# Patient Record
Sex: Female | Born: 1970 | Race: Black or African American | Hispanic: No | Marital: Single | Smoking: Current every day smoker
Health system: Southern US, Community
[De-identification: ages and names within clinical notes are randomized; demographics above are authoritative.]

---

## 2004-07-08 ENCOUNTER — Observation Stay: Payer: Self-pay

## 2004-08-04 ENCOUNTER — Observation Stay: Payer: Self-pay | Admitting: Certified Nurse Midwife

## 2004-08-18 ENCOUNTER — Observation Stay: Payer: Self-pay

## 2004-10-01 ENCOUNTER — Inpatient Hospital Stay: Payer: Self-pay | Admitting: Obstetrics and Gynecology

## 2009-06-25 ENCOUNTER — Encounter: Payer: Self-pay | Admitting: Obstetrics and Gynecology

## 2009-10-12 ENCOUNTER — Ambulatory Visit: Payer: Self-pay | Admitting: Family Medicine

## 2009-11-04 ENCOUNTER — Observation Stay: Payer: Self-pay | Admitting: Obstetrics and Gynecology

## 2009-11-06 ENCOUNTER — Inpatient Hospital Stay: Payer: Self-pay | Admitting: Obstetrics and Gynecology

## 2010-03-09 ENCOUNTER — Emergency Department: Payer: Self-pay | Admitting: Emergency Medicine

## 2015-09-22 ENCOUNTER — Encounter: Payer: Self-pay | Admitting: Emergency Medicine

## 2015-09-22 ENCOUNTER — Emergency Department
Admission: EM | Admit: 2015-09-22 | Discharge: 2015-09-22 | Disposition: A | Discharge status: Home / Self Care | Payer: Medicaid Other | Attending: Emergency Medicine | Admitting: Emergency Medicine

## 2015-09-22 DIAGNOSIS — R350 Frequency of micturition: Secondary | ICD-10-CM | POA: Diagnosis present

## 2015-09-22 DIAGNOSIS — Z3202 Encounter for pregnancy test, result negative: Secondary | ICD-10-CM | POA: Diagnosis not present

## 2015-09-22 DIAGNOSIS — F17200 Nicotine dependence, unspecified, uncomplicated: Secondary | ICD-10-CM | POA: Diagnosis not present

## 2015-09-22 DIAGNOSIS — N390 Urinary tract infection, site not specified: Secondary | ICD-10-CM | POA: Diagnosis not present

## 2015-09-22 LAB — URINALYSIS COMPLETE WITH MICROSCOPIC (ARMC ONLY)
BACTERIA UA: NONE SEEN
Bilirubin Urine: NEGATIVE
GLUCOSE, UA: NEGATIVE mg/dL
Ketones, ur: NEGATIVE mg/dL
Nitrite: NEGATIVE
PH: 6 (ref 5.0–8.0)
PROTEIN: 100 mg/dL — AB
SPECIFIC GRAVITY, URINE: 1.023 (ref 1.005–1.030)

## 2015-09-22 LAB — POCT PREGNANCY, URINE: PREG TEST UR: NEGATIVE

## 2015-09-22 MED ORDER — SULFAMETHOXAZOLE-TRIMETHOPRIM 800-160 MG PO TABS: 1.0000 | ORAL_TABLET | Freq: Two times a day (BID) | ORAL | Status: DC

## 2015-09-22 MED ORDER — PHENAZOPYRIDINE HCL 100 MG PO TABS: ORAL_TABLET | ORAL | Status: DC

## 2015-09-22 NOTE — ED Notes (Signed)
Reports urinary frequency and once she goes not much comes out.  Pelvic pain.  NAD

## 2015-09-22 NOTE — ED Notes (Signed)
POC preg test negative.  

## 2015-09-22 NOTE — ED Provider Notes (Signed)
[facility] Emergency Department Provider Note ____________________________________________  Time seen: Approximately 12:43 PM  I have reviewed the triage vital signs and the nursing notes.   HISTORY  Chief Complaint Urinary Frequency   HPI Joanna Marks is a 45 y.o. female is here complaining of urinary frequency since yesterday. Patient denies any fever or chills. She has had no nausea or vomiting. The patient has had a history of urinary tract infections in the past but not recently. She denies any vaginal discharge.   History reviewed. No pertinent past medical history.  There are no active problems to display for this patient.   History reviewed. No pertinent past surgical history.  Current Outpatient Rx  Name  Route  Sig  Dispense  Refill  . phenazopyridine (PYRIDIUM) 100 MG tablet      Take 1-2 tablets tid prn burning   20 tablet   0   . sulfamethoxazole-trimethoprim (BACTRIM DS,SEPTRA DS) 800-160 MG tablet   Oral   Take 1 tablet by mouth 2 (two) times daily.   20 tablet   0     Allergies Review of patient's allergies indicates no known allergies.  History reviewed. No pertinent family history.  Social History Social History  Substance Use Topics  . Smoking status: Current Every Day Smoker  . Smokeless tobacco: None  . Alcohol Use: None    Review of Systems Constitutional: No fever/chills ENT: No sore throat. Cardiovascular: Denies chest pain. Respiratory: Denies shortness of breath. Gastrointestinal: Positive lower abdominal pain.  No nausea, no vomiting.   Genitourinary: Positive for dysuria. Musculoskeletal: Negative for back pain. Skin: Negative for rash. Neurological: Negative for headaches, focal weakness or numbness.  10-point ROS otherwise negative.  ____________________________________________   PHYSICAL EXAM:  VITAL SIGNS: ED Triage Vitals  Enc Vitals Group     BP 09/22/15 1214 126/58 mmHg     Pulse  Rate 09/22/15 1214 89     Resp 09/22/15 1214 18     Temp 09/22/15 1214 97.3 F (36.3 C)     Temp Source 09/22/15 1214 Oral     SpO2 09/22/15 1214 98 %     Weight 09/22/15 1214 204 lb (92.534 kg)     Height 09/22/15 1214 5\' 3"  (1.6 m)     Head Cir --      Peak Flow --      Pain Score 09/22/15 1215 4     Pain Loc --      Pain Edu? --      Excl. in [place]? --     Constitutional: Alert and oriented. Well appearing and in no acute distress. Eyes: Conjunctivae are normal. PERRL. EOMI. Head: Atraumatic. Nose: No congestion/rhinnorhea. Neck: No stridor.   Cardiovascular: Normal rate, regular rhythm. Grossly normal heart sounds.  Good peripheral circulation. Respiratory: Normal respiratory effort.  No retractions. Lungs CTAB. Gastrointestinal: Soft and nontender. No distention. No CVA tenderness. Musculoskeletal: No lower extremity tenderness nor edema.  No joint effusions. Neurologic:  Normal speech and language. No gross focal neurologic deficits are appreciated. No gait instability. Skin:  Skin is warm, dry and intact. No rash noted. Psychiatric: Mood and affect are normal. Speech and behavior are normal.  ____________________________________________   LABS (all labs ordered are listed, but only abnormal results are displayed)  Labs Reviewed  URINALYSIS COMPLETEWITH MICROSCOPIC ([facility] ONLY) - Abnormal; Notable for the following:    Color, Urine YELLOW (*)    APPearance CLOUDY (*)    Hgb urine dipstick 3+ (*)  Protein, ur 100 (*)    Leukocytes, UA 3+ (*)    Squamous Epithelial / LPF 6-30 (*)    All other components within normal limits  POC URINE PREG, ED  POCT PREGNANCY, URINE    PROCEDURES  Procedure(s) performed: None  Critical Care performed: No  ____________________________________________   INITIAL IMPRESSION / ASSESSMENT AND PLAN / ED COURSE  Pertinent labs & imaging results that were available during my care of the patient were reviewed by me and considered in  my medical decision making (see chart for details).  Patient was started on Bactrim DS twice a day for 10 days. She is also given a prescription for Pyridium as needed for dysuria. Patient is follow-up with her primary care doctor after finishing the antibiotic. ____________________________________________   FINAL CLINICAL IMPRESSION(S) / ED DIAGNOSES  Final diagnoses:  Acute urinary tract infection      Johnn Hai, PA-C 09/22/15 1652  Harvest Dark, MD 09/23/15 2257

## 2015-09-22 NOTE — Discharge Instructions (Signed)
Take medication until completely finished as directed on the bottle. Be aware that your urine will change colors with the Pyridium. Septra DS for infection and pyridium  Increase fluids. Tylenol as needed for pain. Follow-up at [facility] after finishing the antibiotic to have her urine tested.

## 2015-09-22 NOTE — ED Notes (Signed)
Pt c/o pain above groin after voiding this AM.

## 2017-08-29 ENCOUNTER — Encounter: Payer: Self-pay | Admitting: Medical Oncology

## 2017-08-29 ENCOUNTER — Emergency Department
Admission: EM | Admit: 2017-08-29 | Discharge: 2017-08-29 | Disposition: A | Discharge status: Home / Self Care | Payer: Self-pay | Attending: Emergency Medicine | Admitting: Emergency Medicine

## 2017-08-29 DIAGNOSIS — R52 Pain, unspecified: Secondary | ICD-10-CM | POA: Insufficient documentation

## 2017-08-29 DIAGNOSIS — R69 Illness, unspecified: Secondary | ICD-10-CM

## 2017-08-29 DIAGNOSIS — R509 Fever, unspecified: Secondary | ICD-10-CM | POA: Insufficient documentation

## 2017-08-29 DIAGNOSIS — J111 Influenza due to unidentified influenza virus with other respiratory manifestations: Secondary | ICD-10-CM

## 2017-08-29 DIAGNOSIS — F17200 Nicotine dependence, unspecified, uncomplicated: Secondary | ICD-10-CM | POA: Insufficient documentation

## 2017-08-29 DIAGNOSIS — J029 Acute pharyngitis, unspecified: Secondary | ICD-10-CM | POA: Insufficient documentation

## 2017-08-29 LAB — GROUP A STREP BY PCR: Group A Strep by PCR: NOT DETECTED

## 2017-08-29 LAB — INFLUENZA PANEL BY PCR (TYPE A & B)
Influenza A By PCR: NEGATIVE
Influenza B By PCR: NEGATIVE

## 2017-08-29 MED ORDER — PSEUDOEPH-BROMPHEN-DM 30-2-10 MG/5ML PO SYRP: 5.0000 mL | ORAL_SOLUTION | Freq: Four times a day (QID) | ORAL | 0 refills | Status: DC | PRN

## 2017-08-29 NOTE — ED Notes (Signed)
See triage note  States she woke up with fever of 101 about 4 am  Body aches and sore throat  Low grade fever on arrival

## 2017-08-29 NOTE — ED Triage Notes (Signed)
Pt reports sore throat, body aches and fever that began yesterday.

## 2017-08-29 NOTE — ED Provider Notes (Signed)
[facility] Emergency Department Provider Note  ____________________________________________   First MD Initiated Contact with Patient 08/29/17 0901     (approximate)  I have reviewed the triage vital signs and the nursing notes.   HISTORY  Chief Complaint Sore Throat and Generalized Body Aches   HPI Joanna Marks is a 47 y.o. female is here with complaint of sore throat, body aches, and fever that began yesterday.  Patient states she is also had upper respiratory symptoms for "2 weeks".  She has not taken any over-the-counter medication and states that she felt worse today and could not go to work.  Currently she rates her pain as a 10 out of 10.   History reviewed. No pertinent past medical history.  There are no active problems to display for this patient.   History reviewed. No pertinent surgical history.  Prior to Admission medications   Medication Sig Start Date End Date Taking? Authorizing Provider  brompheniramine-pseudoephedrine-DM 30-2-10 MG/5ML syrup Take 5 mLs by mouth 4 (four) times daily as needed. 08/29/17   Johnn Hai, PA-C    Allergies Penicillins  No family history on file.  Social History Social History   Tobacco Use  . Smoking status: Current Every Day Smoker  Substance Use Topics  . Alcohol use: Not on file  . Drug use: Not on file    Review of Systems Constitutional: Subjective fever/chills Eyes: No visual changes. ENT: Positive sore throat. Cardiovascular: Denies chest pain. Respiratory: Denies shortness of breath. Gastrointestinal: No abdominal pain.  No nausea, no vomiting.  No diarrhea.   Genitourinary: Negative for dysuria. Musculoskeletal: Positive for body aches. Skin: Negative for rash. Neurological: Negative for headaches, focal weakness or numbness. ___________________________________________   PHYSICAL EXAM:  VITAL SIGNS: ED Triage Vitals [08/29/17 0740]  Enc Vitals Group     BP (!)  153/91     Pulse Rate 100     Resp 18     Temp 99.5 F (37.5 C)     Temp Source Oral     SpO2 98 %     Weight 190 lb (86.2 kg)     Height 5\' 4"  (1.626 m)     Head Circumference      Peak Flow      Pain Score 10     Pain Loc      Pain Edu?      Excl. in [place]?    Constitutional: Alert and oriented. Well appearing and in no acute distress. Eyes: Conjunctivae are normal.  Head: Atraumatic. Nose: Moderate congestion/rhinnorhea.  TMs are dull bilaterally. Mouth/Throat: Mucous membranes are moist.  Oropharynx non-erythematous. Neck: No stridor.   Hematological/Lymphatic/Immunilogical: No cervical lymphadenopathy. Cardiovascular: Normal rate, regular rhythm. Grossly normal heart sounds.  Good peripheral circulation. Respiratory: Normal respiratory effort.  No retractions. Lungs CTAB. Gastrointestinal: Soft and nontender. No distention.  No CVA tenderness. Musculoskeletal: Moves upper and lower extremities without any difficulty.  Normal gait was noted. Neurologic:  Normal speech and language. No gross focal neurologic deficits are appreciated. Skin:  Skin is warm, dry and intact. No rash noted. Psychiatric: Mood and affect are normal. Speech and behavior are normal.  ____________________________________________   LABS (all labs ordered are listed, but only abnormal results are displayed)  Labs Reviewed  GROUP A STREP BY PCR  INFLUENZA PANEL BY PCR (TYPE A & B)     PROCEDURES  Procedure(s) performed: None  Procedures  Critical Care performed: No  ____________________________________________   INITIAL IMPRESSION /  ASSESSMENT AND PLAN / ED COURSE Patient was made aware that her strep and influenza test was negative.  Patient was given a note to remain out of work today.  She is also given a prescription for Bromfed-DM.  She is aware that viral illnesses take up to 3 weeks to resolve.  She will continue to increase fluids and take Tylenol or ibuprofen as needed for body aches  or fever.  Patient will follow up with her PCP at [facility] if any continued problems.  ____________________________________________   FINAL CLINICAL IMPRESSION(S) / ED DIAGNOSES  Final diagnoses:  Influenza-like illness     ED Discharge Orders        Ordered    brompheniramine-pseudoephedrine-DM 30-2-10 MG/5ML syrup  4 times daily PRN     08/29/17 0954       Note:  This document was prepared using Dragon voice recognition software and may include unintentional dictation errors.    Johnn Hai, PA-C 08/29/17 1046    Eula Listen, MD 08/29/17 1229

## 2017-08-29 NOTE — Discharge Instructions (Signed)
Follow-up with [facility] if any continued problems.  Begin taking Bromfed as needed for cough and congestion.  Tylenol or ibuprofen as needed for fever or body aches.  Increase fluids.

## 2019-09-16 ENCOUNTER — Other Ambulatory Visit: Payer: Self-pay | Admitting: Family Medicine

## 2019-09-16 ENCOUNTER — Encounter: Payer: Self-pay | Admitting: Family Medicine

## 2019-09-16 DIAGNOSIS — Z1231 Encounter for screening mammogram for malignant neoplasm of breast: Secondary | ICD-10-CM

## 2019-09-19 ENCOUNTER — Other Ambulatory Visit: Payer: Self-pay

## 2019-09-19 ENCOUNTER — Telehealth: Payer: Self-pay

## 2019-09-19 DIAGNOSIS — Z1211 Encounter for screening for malignant neoplasm of colon: Secondary | ICD-10-CM

## 2019-09-19 NOTE — Telephone Encounter (Signed)
Gastroenterology Pre-Procedure Review  Request Date: Wed 10/09/19 Requesting Physician: Dr. Marius Ditch  PATIENT REVIEW QUESTIONS: The patient responded to the following health history questions as indicated:    1. Are you having any GI issues? no 2. Do you have a personal history of Polyps? no 3. Do you have a family history of Colon Cancer or Polyps? yes (Grandfather had colon cancer) 4. Diabetes Mellitus? no 5. Joint replacements in the past 12 months?no 6. Major health problems in the past 3 months?no 7. Any artificial heart valves, MVP, or defibrillator?no    MEDICATIONS & ALLERGIES:    Patient reports the following regarding taking any anticoagulation/antiplatelet therapy:   Plavix, Coumadin, Eliquis, Xarelto, Lovenox, Pradaxa, Brilinta, or Effient? no Aspirin? no  Patient confirms/reports the following medications:  Current Outpatient Medications  Medication Sig Dispense Refill  . brompheniramine-pseudoephedrine-DM 30-2-10 MG/5ML syrup Take 5 mLs by mouth 4 (four) times daily as needed. 120 mL 0   No current facility-administered medications for this visit.    Patient confirms/reports the following allergies:  Allergies  Allergen Reactions  . Penicillins     No orders of the defined types were placed in this encounter.   AUTHORIZATION INFORMATION Primary Insurance: 1D#: Group #:  Secondary Insurance: 1D#: Group #:  SCHEDULE INFORMATION: Date: 10/09/19 Time: Location:[facility]

## 2019-10-07 ENCOUNTER — Other Ambulatory Visit
Admission: RE | Admit: 2019-10-07 | Discharge: 2019-10-07 | Disposition: A | Discharge status: Home / Self Care | Payer: Medicaid Other | Source: Ambulatory Visit | Attending: Gastroenterology | Admitting: Gastroenterology

## 2019-10-07 DIAGNOSIS — Z01812 Encounter for preprocedural laboratory examination: Secondary | ICD-10-CM | POA: Insufficient documentation

## 2019-10-07 DIAGNOSIS — Z20822 Contact with and (suspected) exposure to covid-19: Secondary | ICD-10-CM | POA: Insufficient documentation

## 2019-10-08 ENCOUNTER — Encounter: Payer: Self-pay | Admitting: Gastroenterology

## 2019-10-08 LAB — SARS CORONAVIRUS 2 (TAT 6-24 HRS): SARS Coronavirus 2: NEGATIVE

## 2019-10-09 ENCOUNTER — Other Ambulatory Visit: Payer: Self-pay

## 2019-10-09 ENCOUNTER — Encounter: Payer: Self-pay | Admitting: Gastroenterology

## 2019-10-09 ENCOUNTER — Ambulatory Visit: Payer: Medicaid Other | Admitting: Anesthesiology

## 2019-10-09 ENCOUNTER — Ambulatory Visit
Admission: RE | Admit: 2019-10-09 | Discharge: 2019-10-09 | Disposition: A | Discharge status: Home / Self Care | Payer: Medicaid Other | Attending: Gastroenterology | Admitting: Gastroenterology

## 2019-10-09 ENCOUNTER — Encounter
Admission: RE | Disposition: A | Discharge status: Home / Self Care | Payer: Self-pay | Source: Home / Self Care | Attending: Gastroenterology

## 2019-10-09 DIAGNOSIS — Z880 Allergy status to penicillin: Secondary | ICD-10-CM | POA: Diagnosis not present

## 2019-10-09 DIAGNOSIS — K635 Polyp of colon: Secondary | ICD-10-CM

## 2019-10-09 DIAGNOSIS — Z800 Family history of malignant neoplasm of digestive organs: Secondary | ICD-10-CM | POA: Insufficient documentation

## 2019-10-09 DIAGNOSIS — Z1211 Encounter for screening for malignant neoplasm of colon: Secondary | ICD-10-CM | POA: Insufficient documentation

## 2019-10-09 DIAGNOSIS — D122 Benign neoplasm of ascending colon: Secondary | ICD-10-CM | POA: Diagnosis not present

## 2019-10-09 DIAGNOSIS — F17200 Nicotine dependence, unspecified, uncomplicated: Secondary | ICD-10-CM | POA: Insufficient documentation

## 2019-10-09 HISTORY — PX: COLONOSCOPY WITH PROPOFOL: SHX5780

## 2019-10-09 LAB — POCT PREGNANCY, URINE: Preg Test, Ur: NEGATIVE

## 2019-10-09 SURGERY — COLONOSCOPY WITH PROPOFOL
Anesthesia: General

## 2019-10-09 MED ORDER — PROPOFOL 10 MG/ML IV BOLUS
INTRAVENOUS | Status: DC | PRN
  Administered 2019-10-09: 50 mg via INTRAVENOUS
  Administered 2019-10-09: 80 mg via INTRAVENOUS

## 2019-10-09 MED ORDER — PROPOFOL 500 MG/50ML IV EMUL
INTRAVENOUS | Status: DC | PRN
  Administered 2019-10-09: 140 ug/kg/min via INTRAVENOUS

## 2019-10-09 MED ORDER — SODIUM CHLORIDE 0.9 % IV SOLN: INTRAVENOUS | Status: DC

## 2019-10-09 NOTE — Transfer of Care (Signed)
Immediate Anesthesia Transfer of Care Note  Patient: Joanna Marks  Procedure(s) Performed: COLONOSCOPY WITH PROPOFOL (N/A )  Patient Location: PACU  Anesthesia Type:General  Level of Consciousness: sedated  Airway & Oxygen Therapy: Patient Spontanous Breathing and Patient connected to nasal cannula oxygen  Post-op Assessment: Report given to RN and Post -op Vital signs reviewed and stable  Post vital signs: Reviewed and stable  Last Vitals:  Vitals Value Taken Time  BP 111/63 10/09/19 0952  Temp    Pulse 85 10/09/19 0952  Resp 25 10/09/19 0952  SpO2 98 % 10/09/19 0952  Vitals shown include unvalidated device data.  Last Pain:  Vitals:   10/09/19 0849  PainSc: 0-No pain         Complications: No apparent anesthesia complications

## 2019-10-09 NOTE — Anesthesia Preprocedure Evaluation (Signed)
Anesthesia Evaluation  Patient identified by MRN, date of birth, ID band Patient awake    Reviewed: Allergy & Precautions, H&P , NPO status , Patient's Chart, lab work & pertinent test results, reviewed documented beta blocker date and time   History of Anesthesia Complications Negative for: history of anesthetic complications  Airway Mallampati: II  TM Distance: >3 FB Neck ROM: full    Dental  (+) Dental Advidsory Given, Poor Dentition, Missing, Loose Top left central incisor is visibly loose:   Pulmonary neg pulmonary ROS, former smoker,    Pulmonary exam normal        Cardiovascular Exercise Tolerance: Good negative cardio ROS Normal cardiovascular exam     Neuro/Psych negative neurological ROS  negative psych ROS   GI/Hepatic negative GI ROS, Neg liver ROS,   Endo/Other  negative endocrine ROS  Renal/GU negative Renal ROS  negative genitourinary   Musculoskeletal   Abdominal   Peds  Hematology negative hematology ROS (+)   Anesthesia Other Findings History reviewed. No pertinent past medical history.  Obesity  Reproductive/Obstetrics negative OB ROS                             Anesthesia Physical Anesthesia Plan  ASA: II  Anesthesia Plan: General   Post-op Pain Management:    Induction: Intravenous  PONV Risk Score and Plan: 2 and Propofol infusion and TIVA  Airway Management Planned: Natural Airway and Nasal Cannula  Additional Equipment:   Intra-op Plan:   Post-operative Plan:   Informed Consent: I have reviewed the patients History and Physical, chart, labs and discussed the procedure including the risks, benefits and alternatives for the proposed anesthesia with the patient or authorized representative who has indicated his/her understanding and acceptance.     Dental Advisory Given  Plan Discussed with: Anesthesiologist, CRNA and Surgeon  Anesthesia Plan  Comments:         Anesthesia Quick Evaluation

## 2019-10-09 NOTE — H&P (Signed)
Cephas Darby, MD [address]  [place]  [place], [place] [address]  Main: [phone]  Fax: [phone] Pager: [phone]  Primary Care Physician:  Donnie Coffin, MD Primary Gastroenterologist:  Dr. Cephas Darby  Pre-Procedure History & Physical: HPI:  Joanna Marks is a 49 y.o. female is here for an colonoscopy.   History reviewed. No pertinent past medical history.  History reviewed. No pertinent surgical history.  Prior to Admission medications   Medication Sig Start Date End Date Taking? Authorizing Provider  brompheniramine-pseudoephedrine-DM 30-2-10 MG/5ML syrup Take 5 mLs by mouth 4 (four) times daily as needed. 08/29/17   Johnn Hai, PA-C    Allergies as of 09/19/2019 - Review Complete 08/29/2017  Allergen Reaction Noted  . Penicillins  08/29/2017    History reviewed. No pertinent family history.  Social History   Socioeconomic History  . Marital status: Single    Spouse name: Not on file  . Number of children: Not on file  . Years of education: Not on file  . Highest education level: Not on file  Occupational History  . Not on file  Tobacco Use  . Smoking status: Current Every Day Smoker  . Smokeless tobacco: Never Used  Substance and Sexual Activity  . Alcohol use: Not on file  . Drug use: Not on file  . Sexual activity: Not on file  Other Topics Concern  . Not on file  Social History Narrative  . Not on file   Social Determinants of Health   Financial Resource Strain:   . Difficulty of Paying Living Expenses: Not on file  Food Insecurity:   . Worried About Charity fundraiser in the Last Year: Not on file  . Ran Out of Food in the Last Year: Not on file  Transportation Needs:   . Lack of Transportation (Medical): Not on file  . Lack of Transportation (Non-Medical): Not on file  Physical Activity:   . Days of Exercise per Week: Not on file  . Minutes of Exercise per Session: Not on file  Stress:   . Feeling of Stress  : Not on file  Social Connections:   . Frequency of Communication with Friends and Family: Not on file  . Frequency of Social Gatherings with Friends and Family: Not on file  . Attends Religious Services: Not on file  . Active Member of Clubs or Organizations: Not on file  . Attends Archivist Meetings: Not on file  . Marital Status: Not on file  Intimate Partner Violence:   . Fear of Current or Ex-Partner: Not on file  . Emotionally Abused: Not on file  . Physically Abused: Not on file  . Sexually Abused: Not on file    Review of Systems: See HPI, otherwise negative ROS  Physical Exam: BP 117/78   Pulse 73   Temp 97.7 F (36.5 C)   Resp 18   Ht 5\' 5"  (1.651 m)   Wt 91.6 kg   SpO2 100%   BMI 33.61 kg/m  General:   Alert,  pleasant and cooperative in NAD Head:  Normocephalic and atraumatic. Neck:  Supple; no masses or thyromegaly. Lungs:  Clear throughout to auscultation.    Heart:  Regular rate and rhythm. Abdomen:  Soft, nontender and nondistended. Normal bowel sounds, without guarding, and without rebound.   Neurologic:  Alert and  oriented x4;  grossly normal neurologically.  Impression/Plan: Joanna Marks is here for an colonoscopy to be performed  for colon cancer screening, fam h/o colon cancer   Risks, benefits, limitations, and alternatives regarding  colonoscopy have been reviewed with the patient.  Questions have been answered.  All parties agreeable.   Sherri Sear, MD  10/09/2019, 9:34 AM

## 2019-10-09 NOTE — Op Note (Signed)
[facility] Gastroenterology Patient Name: Joanna Marks Procedure Date: 10/09/2019 9:24 AM MRN: [id number] Account #: [id number] Date of Birth: 11-14-1970 Admit Type: Outpatient Age: 49 Room: [facility] ENDO ROOM 1 Gender: Female Note Status: Finalized Procedure:             Colonoscopy Indications:           Screening for colorectal malignant neoplasm, This is                         the patient's first colonoscopy Providers:             Lin Landsman MD, MD Referring MD:          Edmonia Lynch. Aycock MD (Referring MD) Medicines:             Monitored Anesthesia Care Complications:         No immediate complications. Estimated blood loss: None. Procedure:             Pre-Anesthesia Assessment:                        - Prior to the procedure, a History and Physical was                         performed, and patient medications and allergies were                         reviewed. The patient is competent. The risks and                         benefits of the procedure and the sedation options and                         risks were discussed with the patient. All questions                         were answered and informed consent was obtained.                         Patient identification and proposed procedure were                         verified by the physician, the nurse, the                         anesthesiologist, the anesthetist and the technician                         in the pre-procedure area in the procedure room in the                         endoscopy suite. Mental Status Examination: alert and                         oriented. Airway Examination: normal oropharyngeal                         airway and neck mobility. Respiratory Examination:  clear to auscultation. CV Examination: normal.                         Prophylactic Antibiotics: The patient does not require                         prophylactic antibiotics. Prior  Anticoagulants: The                         patient has taken no previous anticoagulant or                         antiplatelet agents. ASA Grade Assessment: II - A                         patient with mild systemic disease. After reviewing                         the risks and benefits, the patient was deemed in                         satisfactory condition to undergo the procedure. The                         anesthesia plan was to use monitored anesthesia care                         (MAC). Immediately prior to administration of                         medications, the patient was re-assessed for adequacy                         to receive sedatives. The heart rate, respiratory                         rate, oxygen saturations, blood pressure, adequacy of                         pulmonary ventilation, and response to care were                         monitored throughout the procedure. The physical                         status of the patient was re-assessed after the                         procedure.                        After obtaining informed consent, the colonoscope was                         passed under direct vision. Throughout the procedure,                         the patient's blood pressure, pulse, and oxygen  saturations were monitored continuously. The                         Colonoscope was introduced through the anus and                         advanced to the the cecum, identified by appendiceal                         orifice and ileocecal valve. The colonoscopy was                         performed without difficulty. The patient tolerated                         the procedure well. The quality of the bowel                         preparation was evaluated using the BBPS [facility] Bowel                         Preparation Scale) with scores of: Right Colon = 3,                         Transverse Colon = 3 and Left Colon = 3 (entire mucosa                          seen well with no residual staining, small fragments                         of stool or opaque liquid). The total BBPS score                         equals 9. Findings:      The perianal and digital rectal examinations were normal. Pertinent       negatives include normal sphincter tone and no palpable rectal lesions.      A diminutive polyp was found in the ascending colon. The polyp was       sessile. The polyp was removed with a cold biopsy forceps. Resection and       retrieval were complete.      The retroflexed view of the distal rectum and anal verge was normal and       showed no anal or rectal abnormalities.      The exam was otherwise without abnormality. Impression:            - One diminutive polyp in the ascending colon, removed                         with a cold biopsy forceps. Resected and retrieved.                        - The distal rectum and anal verge are normal on                         retroflexion view.                        -  The examination was otherwise normal. Recommendation:        - Discharge patient to home (with escort).                        - Resume previous diet today.                        - Continue present medications.                        - Await pathology results.                        - Repeat colonoscopy in 7-10 years for surveillance. Procedure Code(s):     --- Professional ---                        [phone], Colonoscopy, flexible; with biopsy, single or                         multiple Diagnosis Code(s):     --- Professional ---                        Z12.11, Encounter for screening for malignant neoplasm                         of colon                        K63.5, Polyp of colon CPT copyright 2019 [employer] Association. All rights reserved. The codes documented in this report are preliminary and upon coder review may  be revised to meet current compliance requirements. Dr. Ulyess Mort Lin Landsman  MD, MD 10/09/2019 9:50:44 AM This report has been signed electronically. Number of Addenda: 0 Note Initiated On: 10/09/2019 9:24 AM Scope Withdrawal Time: 0 hours 5 minutes 16 seconds  Total Procedure Duration: 0 hours 8 minutes 22 seconds  Estimated Blood Loss:  Estimated blood loss: none.      [facility]

## 2019-10-09 NOTE — Anesthesia Postprocedure Evaluation (Signed)
Anesthesia Post Note  Patient: Joanna Marks  Procedure(s) Performed: COLONOSCOPY WITH PROPOFOL (N/A )  Patient location during evaluation: Endoscopy Anesthesia Type: General Level of consciousness: awake and alert Pain management: pain level controlled Vital Signs Assessment: post-procedure vital signs reviewed and stable Respiratory status: spontaneous breathing, nonlabored ventilation, respiratory function stable and patient connected to nasal cannula oxygen Cardiovascular status: blood pressure returned to baseline and stable Postop Assessment: no apparent nausea or vomiting Anesthetic complications: no     Last Vitals:  Vitals:   10/09/19 1011 10/09/19 1021  BP: 125/88 (!) 158/98  Pulse: 77 70  Resp: 12 19  Temp:    SpO2: 100% 100%    Last Pain:  Vitals:   10/09/19 1021  TempSrc:   PainSc: 0-No pain                 Martha Clan

## 2019-10-10 ENCOUNTER — Encounter: Payer: Self-pay | Admitting: *Deleted

## 2019-10-10 LAB — SURGICAL PATHOLOGY

## 2019-10-11 ENCOUNTER — Encounter: Payer: Self-pay | Admitting: Gastroenterology

## 2019-10-14 ENCOUNTER — Encounter: Payer: Self-pay | Admitting: Emergency Medicine

## 2019-10-14 ENCOUNTER — Emergency Department: Payer: Medicaid Other

## 2019-10-14 ENCOUNTER — Emergency Department
Admission: EM | Admit: 2019-10-14 | Discharge: 2019-10-14 | Disposition: A | Discharge status: Home / Self Care | Payer: Medicaid Other | Attending: Student | Admitting: Student

## 2019-10-14 ENCOUNTER — Other Ambulatory Visit: Payer: Self-pay

## 2019-10-14 DIAGNOSIS — M545 Low back pain: Secondary | ICD-10-CM | POA: Insufficient documentation

## 2019-10-14 DIAGNOSIS — Y93E1 Activity, personal bathing and showering: Secondary | ICD-10-CM | POA: Diagnosis not present

## 2019-10-14 DIAGNOSIS — Y999 Unspecified external cause status: Secondary | ICD-10-CM | POA: Diagnosis not present

## 2019-10-14 DIAGNOSIS — M7918 Myalgia, other site: Secondary | ICD-10-CM | POA: Insufficient documentation

## 2019-10-14 DIAGNOSIS — Y929 Unspecified place or not applicable: Secondary | ICD-10-CM | POA: Diagnosis not present

## 2019-10-14 DIAGNOSIS — M549 Dorsalgia, unspecified: Secondary | ICD-10-CM

## 2019-10-14 DIAGNOSIS — F17200 Nicotine dependence, unspecified, uncomplicated: Secondary | ICD-10-CM | POA: Insufficient documentation

## 2019-10-14 DIAGNOSIS — W01198A Fall on same level from slipping, tripping and stumbling with subsequent striking against other object, initial encounter: Secondary | ICD-10-CM | POA: Insufficient documentation

## 2019-10-14 DIAGNOSIS — Y92009 Unspecified place in unspecified non-institutional (private) residence as the place of occurrence of the external cause: Secondary | ICD-10-CM

## 2019-10-14 DIAGNOSIS — W19XXXA Unspecified fall, initial encounter: Secondary | ICD-10-CM

## 2019-10-14 MED ORDER — OXYCODONE-ACETAMINOPHEN 7.5-325 MG PO TABS: 1.0000 | ORAL_TABLET | Freq: Four times a day (QID) | ORAL | 0 refills | Status: AC | PRN

## 2019-10-14 MED ORDER — IBUPROFEN 600 MG PO TABS: 600.0000 mg | ORAL_TABLET | Freq: Three times a day (TID) | ORAL | 0 refills | Status: AC | PRN

## 2019-10-14 MED ORDER — OXYCODONE-ACETAMINOPHEN 5-325 MG PO TABS
1.0000 | ORAL_TABLET | Freq: Once | ORAL | Status: AC
  Administered 2019-10-14: 1 via ORAL
  Filled 2019-10-14: qty 1

## 2019-10-14 MED ORDER — CYCLOBENZAPRINE HCL 10 MG PO TABS
10.0000 mg | ORAL_TABLET | Freq: Once | ORAL | Status: AC
  Administered 2019-10-14: 10 mg via ORAL
  Filled 2019-10-14: qty 1

## 2019-10-14 NOTE — Discharge Instructions (Signed)
Follow discharge care instruction take medication as directed. °

## 2019-10-14 NOTE — ED Provider Notes (Signed)
[facility] Emergency Department Provider Note   ____________________________________________   First MD Initiated Contact with Patient 10/14/19 [phone]     (approximate)  I have reviewed the triage vital signs and the nursing notes.   HISTORY  Chief Complaint Fall and Back Pain    HPI Joanna Marks is a 49 y.o. female patient complaint of mechanical fall while getting out of shower today.  Patient that she slipped and fell hitting her lower back on the tub.  Patient denies LOC, head injury or neck pain.  Patient the pain increased with ambulation.  Patient denies radicular component to her back pain.  Patient denies bladder or bowel dysfunction.  Patient was driven to the ED by her husband.  Patient rates the pain as a 10/10.  Patient described the pain as "achy".  No palliative measure prior to arrival.         History reviewed. No pertinent past medical history.  Patient Active Problem List   Diagnosis Date Noted  . Encounter for screening colonoscopy     Past Surgical History:  Procedure Laterality Date  . COLONOSCOPY WITH PROPOFOL N/A 10/09/2019   Procedure: COLONOSCOPY WITH PROPOFOL;  Surgeon: Lin Landsman, MD;  Location: [facility] ENDOSCOPY;  Service: Gastroenterology;  Laterality: N/A;  Priority 4    Prior to Admission medications   Medication Sig Start Date End Date Taking? Authorizing Provider  ibuprofen (ADVIL) 600 MG tablet Take 1 tablet (600 mg total) by mouth every 8 (eight) hours as needed. 10/14/19   Sable Feil, PA-C  oxyCODONE-acetaminophen (PERCOCET) 7.5-325 MG tablet Take 1 tablet by mouth every 6 (six) hours as needed for severe pain. 10/14/19   Sable Feil, PA-C    Allergies Penicillins  No family history on file.  Social History Social History   Tobacco Use  . Smoking status: Current Every Day Smoker  . Smokeless tobacco: Never Used  Substance Use Topics  . Alcohol use: Not on file  . Drug use: Not on file     Review of Systems Constitutional: No fever/chills Eyes: No visual changes. ENT: No sore throat. Cardiovascular: Denies chest pain. Respiratory: Denies shortness of breath. Gastrointestinal: No abdominal pain.  No nausea, no vomiting.  No diarrhea.  No constipation. Genitourinary: Negative for dysuria. Musculoskeletal: Positive for back pain. Skin: Negative for rash. Neurological: Negative for headaches, focal weakness or numbness. Allergic/Immunilogical: Penicillin  ____________________________________________   PHYSICAL EXAM:  VITAL SIGNS: ED Triage Vitals [10/14/19 0903]  Enc Vitals Group     BP (!) 148/79     Pulse Rate 79     Resp 16     Temp 98.3 F (36.8 C)     Temp Source Oral     SpO2 97 %     Weight 190 lb (86.2 kg)     Height 5\' 4"  (1.626 m)     Head Circumference      Peak Flow      Pain Score 10     Pain Loc      Pain Edu?      Excl. in [place]?    Constitutional: Alert and oriented. Well appearing and in no acute distress. Neck: No cervical spine tenderness to palpation. Hematological/Lymphatic/Immunilogical: No cervical lymphadenopathy. Cardiovascular: Normal rate, regular rhythm. Grossly normal heart sounds.  Good peripheral circulation. Respiratory: Normal respiratory effort.  No retractions. Lungs CTAB. Gastrointestinal: Soft and nontender. No distention. No abdominal bruits. No CVA tenderness. Genitourinary: Deferred Musculoskeletal: No obvious spinal deformity.  Patient has mild guarding palpation of L4-S1.  No lower extremity tenderness nor edema.   Neurologic:  Normal speech and language. No gross focal neurologic deficits are appreciated. No gait instability. Skin:  Skin is warm, dry and intact. No rash noted. Psychiatric: Mood and affect are normal. Speech and behavior are normal.  ____________________________________________   LABS (all labs ordered are listed, but only abnormal results are displayed)  Labs Reviewed - No data to  display ____________________________________________  EKG   ____________________________________________  RADIOLOGY  ED MD interpretation:    Official radiology report(s): DG Lumbar Spine 2-3 Views  Result Date: 10/14/2019 CLINICAL DATA:  Right low back pain.  Fall. EXAM: LUMBAR SPINE - 2-3 VIEW COMPARISON:  None. FINDINGS: Normal alignment. Disc spaces maintained. Early anterior degenerative spurring and degenerative facet disease. SI joints symmetric and unremarkable. No fracture. IMPRESSION: No acute bony abnormality. Electronically Signed   By: Rolm Baptise M.D.   On: 10/14/2019 09:44    ____________________________________________   PROCEDURES  Procedure(s) performed (including Critical Care):  Procedures   ____________________________________________   INITIAL IMPRESSION / ASSESSMENT AND PLAN / ED COURSE  As part of my medical decision making, I reviewed the following data within the [place]     Patient presents with right lateral back pain secondary to a fall.  Patient denies radicular component to her back pain.  Patient denies bladder bowel dysfunction.  Discussed x-ray findings with patient.  Patient physical exam consistent with back contusion.  Patient given discharge care instruction advised take medication as directed.  Patient advised follow-up PCP.    Joanna Marks was evaluated in Emergency Department on 10/14/2019 for the symptoms described in the history of present illness. She was evaluated in the context of the global COVID-19 pandemic, which necessitated consideration that the patient might be at risk for infection with the SARS-CoV-2 virus that causes COVID-19. Institutional protocols and algorithms that pertain to the evaluation of patients at risk for COVID-19 are in a state of rapid change based on information released by regulatory bodies including the CDC and federal and state organizations. These policies and algorithms were  followed during the patient's care in the ED.       ____________________________________________   FINAL CLINICAL IMPRESSION(S) / ED DIAGNOSES  Final diagnoses:  Fall in home, initial encounter  Musculoskeletal back pain     ED Discharge Orders         Ordered    oxyCODONE-acetaminophen (PERCOCET) 7.5-325 MG tablet  Every 6 hours PRN     10/14/19 1003    ibuprofen (ADVIL) 600 MG tablet  Every 8 hours PRN     10/14/19 1003           Note:  This document was prepared using Dragon voice recognition software and may include unintentional dictation errors.    Sable Feil, PA-C 10/14/19 1005    Lilia Pro., MD 10/14/19 1910

## 2019-10-14 NOTE — ED Notes (Signed)
See triage note  Presents with pain to lower back   States she slipped in shower  Hitting her back

## 2019-10-14 NOTE — ED Triage Notes (Signed)
Pt in via POV from home, reports mechanical fall as she was getting out of shower today, complaints of pain to lower back with difficulty ambulating.  NAD noted at this time.

## 2020-02-21 ENCOUNTER — Other Ambulatory Visit: Payer: Self-pay

## 2020-02-24 ENCOUNTER — Other Ambulatory Visit: Payer: Medicaid Other

## 2020-02-25 ENCOUNTER — Other Ambulatory Visit: Payer: Medicaid Other

## 2020-03-03 ENCOUNTER — Other Ambulatory Visit: Payer: Self-pay

## 2020-03-03 ENCOUNTER — Ambulatory Visit (INDEPENDENT_AMBULATORY_CARE_PROVIDER_SITE_OTHER): Payer: Medicaid Other

## 2020-03-03 ENCOUNTER — Encounter: Payer: Self-pay | Admitting: Podiatry

## 2020-03-03 ENCOUNTER — Ambulatory Visit: Payer: Medicaid Other | Admitting: Podiatry

## 2020-03-03 DIAGNOSIS — M7661 Achilles tendinitis, right leg: Secondary | ICD-10-CM | POA: Diagnosis not present

## 2020-03-03 DIAGNOSIS — M7660 Achilles tendinitis, unspecified leg: Secondary | ICD-10-CM | POA: Diagnosis not present

## 2020-03-03 DIAGNOSIS — M7662 Achilles tendinitis, left leg: Secondary | ICD-10-CM

## 2020-03-03 DIAGNOSIS — M659 Synovitis and tenosynovitis, unspecified: Secondary | ICD-10-CM | POA: Diagnosis not present

## 2020-03-03 MED ORDER — METHYLPREDNISOLONE 4 MG PO TBPK: ORAL_TABLET | ORAL | 0 refills | Status: DC

## 2020-03-03 MED ORDER — MELOXICAM 15 MG PO TABS: 15.0000 mg | ORAL_TABLET | Freq: Every day | ORAL | 1 refills | Status: AC

## 2020-03-03 NOTE — Progress Notes (Signed)
   HPI: 49 y.o. female presenting today as a new patient for evaluation of bilateral Achilles tendon pain and right ankle pain.  Patient states that she has had chronic pain in these areas off and on for 5 years now.  At one point she did receive steroid injections which did help approximately 4 years ago.  Patient denies trauma or injury to the area.  She presents for further treatment evaluation  No past medical history on file.    Physical Exam: General: The patient is alert and oriented x3 in no acute distress.  Dermatology: Skin is warm, dry and supple bilateral lower extremities. Negative for open lesions or macerations.  Vascular: Palpable pedal pulses bilaterally. No edema or erythema noted. Capillary refill within normal limits.  Neurological: Epicritic and protective threshold grossly intact bilaterally.   Musculoskeletal Exam: Pain on palpation noted to the posterior tubercle of the bilateral calcaneus at the insertion of the Achilles tendon consistent with retrocalcaneal bursitis. Range of motion within normal limits.  There is also pain on palpation and range of motion to the lateral aspect of the right ankle joint.  Muscle strength 5/5 in all muscle groups bilateral lower extremities.  Radiographic Exam:  Posterior calcaneal spur noted to the respective calcaneus on lateral view. No fracture or dislocation noted. Normal osseous mineralization noted.     Assessment: 1. Insertional Achilles tendinitis bilateral 2.  Synovitis of right ankle   Plan of Care:  1. Patient was evaluated. Radiographs were reviewed today. 2. Injection of 0.5 mL Celestone Soluspan injected into the retrocalcaneal bursa. Care was taken to avoid direct injection into the Achilles tendon. 3.  Injection of 0.5 cc Celestone Soluspan injected into the right ankle joint lateral aspect 4.  Prescription for Medrol Dosepak 5.  Prescription for meloxicam to begin after completion of the Dosepak 6.  Appointment  with Pedorthist for custom molded orthotics 7.  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 Edrick Kins, [address]                                        [place], [place] [address]                Office [phone]  Fax [phone]

## 2020-03-10 ENCOUNTER — Other Ambulatory Visit: Payer: Medicaid Other

## 2020-03-31 ENCOUNTER — Other Ambulatory Visit: Payer: Self-pay

## 2020-03-31 ENCOUNTER — Ambulatory Visit: Payer: Medicaid Other | Admitting: Podiatry

## 2020-03-31 DIAGNOSIS — M7661 Achilles tendinitis, right leg: Secondary | ICD-10-CM

## 2020-03-31 DIAGNOSIS — M7662 Achilles tendinitis, left leg: Secondary | ICD-10-CM

## 2020-03-31 DIAGNOSIS — M659 Synovitis and tenosynovitis, unspecified: Secondary | ICD-10-CM

## 2020-03-31 NOTE — Progress Notes (Signed)
   HPI: 49 y.o. female presenting today for follow-up evaluation of bilateral Achilles tendon pain and right ankle pain.  Patient states that she is doing somewhat better.  She still has some residual pain and tenderness to the Achilles tendons and right ankle.  She has been stretching daily.  She also completed the Medrol Dosepak and is currently taking meloxicam daily.  She has an appointment tomorrow, 04/01/2020, with the Pedorthist for custom molded insoles.  No new complaints at this time  No past medical history on file.    Physical Exam: General: The patient is alert and oriented x3 in no acute distress.  Dermatology: Skin is warm, dry and supple bilateral lower extremities. Negative for open lesions or macerations.  Vascular: Palpable pedal pulses bilaterally. No edema or erythema noted. Capillary refill within normal limits.  Neurological: Epicritic and protective threshold grossly intact bilaterally.   Musculoskeletal Exam: Pain on palpation noted to the posterior tubercle of the bilateral calcaneus at the insertion of the Achilles tendon consistent with retrocalcaneal bursitis. Range of motion within normal limits.  There is also pain on palpation and range of motion to the lateral aspect of the right ankle joint.  Muscle strength 5/5 in all muscle groups bilateral lower extremities.  Assessment: 1. Insertional Achilles tendinitis bilateral 2.  Synovitis of right ankle-lateral aspect   Plan of Care:  1. Patient was evaluated.  2. Injection of 0.5 mL Celestone Soluspan injected into the retrocalcaneal bursa. Care was taken to avoid direct injection into the Achilles tendon. 3.  Injection of 0.5 cc Celestone Soluspan injected into the right ankle joint lateral aspect 4.  Order placed today for physical therapy at [facility] 5.  Continue meloxicam 15 mg daily 6.  Appointment with Pedorthist for custom molded orthotics tomorrow, 04/01/2020 7.  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 Edrick Kins, [place] [address]                                        [place], [place] [address]                Office [phone]  Fax [phone]

## 2020-04-01 ENCOUNTER — Other Ambulatory Visit: Payer: Medicaid Other | Admitting: Orthotics

## 2020-05-12 ENCOUNTER — Ambulatory Visit: Payer: Medicaid Other | Admitting: Podiatry

## 2020-05-19 ENCOUNTER — Ambulatory Visit: Payer: Medicaid Other | Admitting: Podiatry

## 2020-05-19 ENCOUNTER — Other Ambulatory Visit: Payer: Self-pay

## 2020-05-19 DIAGNOSIS — M659 Synovitis and tenosynovitis, unspecified: Secondary | ICD-10-CM | POA: Diagnosis not present

## 2020-05-19 DIAGNOSIS — M7662 Achilles tendinitis, left leg: Secondary | ICD-10-CM | POA: Diagnosis not present

## 2020-05-19 DIAGNOSIS — M7661 Achilles tendinitis, right leg: Secondary | ICD-10-CM | POA: Diagnosis not present

## 2020-05-24 MED ORDER — METHYLPREDNISOLONE 4 MG PO TBPK: ORAL_TABLET | ORAL | 0 refills | Status: AC

## 2020-05-24 NOTE — Progress Notes (Signed)
   HPI: 49 y.o. female presenting today for follow-up evaluation of bilateral Achilles tendon pain and right ankle pain.  Patient states that now she is beginning to experience left ankle pain as well.  She was not able to have physical therapy performed at [facility] because of insurance purposes.  She states that the injections did provide some relief.  She unfortunately missed her appointment with Pedorthist for custom molded orthotics.  She presents for further treatment and evaluation  No past medical history on file.    Physical Exam: General: The patient is alert and oriented x3 in no acute distress.  Dermatology: Skin is warm, dry and supple bilateral lower extremities. Negative for open lesions or macerations.  Vascular: Palpable pedal pulses bilaterally. No edema or erythema noted. Capillary refill within normal limits.  Neurological: Epicritic and protective threshold grossly intact bilaterally.   Musculoskeletal Exam: Pain on palpation noted to the posterior tubercle of the bilateral calcaneus at the insertion of the Achilles tendon consistent with retrocalcaneal bursitis. Range of motion within normal limits.  There is also pain on palpation and range of motion to the lateral aspect of the bilateral ankle joints.  Muscle strength 5/5 in all muscle groups bilateral lower extremities.  Assessment: 1. Insertional Achilles tendinitis bilateral 2.  Ankle synovitis/capsulitis bilateral   Plan of Care:  1. Patient was evaluated.  2.  Injection of 0.5 cc Celestone Soluspan injected into the bilateral ankle joints 3.  New order placed today for physical therapy at [facility] outpatient sports and rehab  4.  New prescription for a Medrol Dosepak, then continue meloxicam 15 mg daily 5.  New appointment made with Pedorthist for custom molded orthotics  6.  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 Edrick Kins, [place]                                        [place], [place] [address]                Office [phone]  Fax [phone]

## 2020-06-03 ENCOUNTER — Other Ambulatory Visit: Payer: Medicaid Other | Admitting: Orthotics

## 2020-07-24 ENCOUNTER — Encounter: Payer: Self-pay | Admitting: Podiatry

## 2020-07-24 ENCOUNTER — Ambulatory Visit: Payer: Medicaid Other | Admitting: Podiatry

## 2020-07-24 ENCOUNTER — Other Ambulatory Visit: Payer: Self-pay

## 2020-07-24 DIAGNOSIS — M7661 Achilles tendinitis, right leg: Secondary | ICD-10-CM

## 2020-07-24 DIAGNOSIS — M7662 Achilles tendinitis, left leg: Secondary | ICD-10-CM | POA: Diagnosis not present

## 2020-07-24 MED ORDER — BETAMETHASONE SOD PHOS & ACET 6 (3-3) MG/ML IJ SUSP
3.0000 mg | Freq: Once | INTRAMUSCULAR | Status: AC
  Administered 2020-07-24: 3 mg via INTRAMUSCULAR

## 2020-07-24 NOTE — Progress Notes (Signed)
   HPI: 49 y.o. female presenting today for follow-up evaluation of bilateral Achilles tendon pain and and bilateral ankle pain.  Patient states that the ankles are feeling much better.  She continues to have Achilles tendons pain to the bilateral feet.  She has not set up physical therapy however yet.  She also has not made an appointment with our Pedorthist for custom orthotics.  She presents for further treatment evaluation  No past medical history on file.    Physical Exam: General: The patient is alert and oriented x3 in no acute distress.  Dermatology: Skin is warm, dry and supple bilateral lower extremities. Negative for open lesions or macerations.  Vascular: Palpable pedal pulses bilaterally. No edema or erythema noted. Capillary refill within normal limits.  Neurological: Epicritic and protective threshold grossly intact bilaterally.   Musculoskeletal Exam: Pain on palpation noted to the posterior tubercle of the bilateral calcaneus at the insertion of the Achilles tendon consistent with retrocalcaneal bursitis. Range of motion within normal limits.  There is also improved pain on palpation and range of motion to the lateral aspect of the right ankle joint.  Muscle strength 5/5 in all muscle groups bilateral lower extremities.  Assessment: 1. Insertional Achilles tendinitis bilateral 2.  Synovitis of bilateral ankles-resolved   Plan of Care:  1. Patient was evaluated.  2. Injection of 0.5 mL Celestone Soluspan injected into the retrocalcaneal bursa. Care was taken to avoid direct injection into the Achilles tendon. 3.  Physical therapy at [facility] sports rehab pending Four.  New appointment was made today with Pedorthist for custom molded orthotics Five.  Continue meloxicam daily Six.  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 Edrick Kins, [place]                                         [place], [place] [address]                Office [phone]  Fax [phone]

## 2020-08-10 ENCOUNTER — Ambulatory Visit: Payer: Medicaid Other

## 2020-09-04 ENCOUNTER — Ambulatory Visit: Payer: Medicaid Other | Admitting: Podiatry

## 2020-09-25 ENCOUNTER — Other Ambulatory Visit: Payer: Self-pay

## 2020-09-25 ENCOUNTER — Ambulatory Visit: Payer: Medicaid Other | Admitting: Podiatry

## 2020-09-25 ENCOUNTER — Ambulatory Visit (INDEPENDENT_AMBULATORY_CARE_PROVIDER_SITE_OTHER): Payer: Medicaid Other

## 2020-09-25 ENCOUNTER — Encounter: Payer: Self-pay | Admitting: Podiatry

## 2020-09-25 DIAGNOSIS — M7661 Achilles tendinitis, right leg: Secondary | ICD-10-CM

## 2020-09-25 DIAGNOSIS — M7732 Calcaneal spur, left foot: Secondary | ICD-10-CM

## 2020-09-25 DIAGNOSIS — M7662 Achilles tendinitis, left leg: Secondary | ICD-10-CM | POA: Diagnosis not present

## 2020-09-25 MED ORDER — BETAMETHASONE SOD PHOS & ACET 6 (3-3) MG/ML IJ SUSP
3.0000 mg | Freq: Once | INTRAMUSCULAR | Status: AC
  Administered 2020-09-25: 3 mg via INTRA_ARTICULAR

## 2020-09-25 NOTE — Progress Notes (Signed)
   HPI: 50 y.o. female presenting today for follow-up evaluation of bilateral Achilles tendon pain and and bilateral ankle pain.  Patient states that she is not having any problems with her right lower extremity at the moment.  She continues to have severe insertional Achilles tendon pain to the left lower extremity.  This is now been ongoing for over 1 year now despite multiple conservative modalities which have failed to provide any sort of lasting alleviation of symptoms for the patient.  At this point she would like to proceed with surgical intervention to the left posterior heel which was discussed last visit  No past medical history on file.    Physical Exam: General: The patient is alert and oriented x3 in no acute distress.  Dermatology: Skin is warm, dry and supple bilateral lower extremities. Negative for open lesions or macerations.  Vascular: Palpable pedal pulses bilaterally. No edema or erythema noted. Capillary refill within normal limits.  Neurological: Epicritic and protective threshold grossly intact bilaterally.   Musculoskeletal Exam: Pain on palpation noted to the posterior tubercle of the bilateral calcaneus at the insertion of the Achilles tendon left. Range of motion within normal limits.  There is also improved pain on palpation and range of motion to the lateral aspect of the right ankle joint.  Muscle strength 5/5 in all muscle groups bilateral lower extremities.  Radiographic Exam: Posterior heel spur noted on lateral view.  Joint spaces maintained.  No fracture identified.  Normal osseous mineralization.  Assessment: 1. Insertional Achilles tendinitis left 2.  Posterior heel spur left   Plan of Care:  1. Patient was evaluated.  2. Injection of 0.5 mL Celestone Soluspan injected into the retrocalcaneal bursa. Care was taken to avoid direct injection into the Achilles tendon left. 3.  Continue meloxicam as needed 4. Today we discussed the conservative versus  surgical management of the presenting pathology. The patient opts for surgical management. All possible complications and details of the procedure were explained. All patient questions were answered. No guarantees were expressed or implied. 5. Authorization for surgery was initiated today. Surgery will consist of retrocalcaneal exostectomy with repair of Achilles tendon left 6.  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 Edrick Kins, DPM    [address], [place] [address]                Office [phone]  Fax [phone]

## 2020-09-25 NOTE — Addendum Note (Signed)
Addended by: Graceann Congress D on: 09/25/2020 09:29 AM   Modules accepted: Orders

## 2020-09-25 NOTE — Patient Instructions (Signed)
Pre-Operative Instructions  Congratulations, you have decided to take an important step towards improving your quality of life.  You can be assured that the doctors and staff at [facility] will be with you every step of the way.  Here are some important things you should know:  1. Plan to be at the surgery center/hospital at least 1 (one) hour prior to your scheduled time, unless otherwise directed by the surgical center/hospital staff.  You must have a responsible adult accompany you, remain during the surgery and drive you home.  Make sure you have directions to the surgical center/hospital to ensure you arrive on time. 2. If you are having surgery at [facility] or [facility] hospitals, you will need a copy of your medical history and physical form from your family physician within one month prior to the date of surgery. We will give you a form for your primary physician to complete.  3. We make every effort to accommodate the date you request for surgery.  However, there are times where surgery dates or times have to be moved.  We will contact you as soon as possible if a change in schedule is required.   4. No aspirin/ibuprofen for one week before surgery.  If you are on aspirin, any non-steroidal anti-inflammatory medications (Mobic, Aleve, Ibuprofen) should not be taken seven (7) days prior to your surgery.  You make take Tylenol for pain prior to surgery.  5. Medications - If you are taking daily heart and blood pressure medications, seizure, reflux, allergy, asthma, anxiety, pain or diabetes medications, make sure you notify the surgery center/hospital before the day of surgery so they can tell you which medications you should take or avoid the day of surgery. 6. No food or drink after midnight the night before surgery unless directed otherwise by surgical center/hospital staff. 7. No alcoholic beverages 24-hours prior to surgery.  No smoking 24-hours prior or 24-hours after  surgery. 8. Wear loose pants or shorts. They should be loose enough to fit over bandages, boots, and casts. 9. Don't wear slip-on shoes. Sneakers are preferred. 10. Bring your boot with you to the surgery center/hospital.  Also bring crutches or a walker if your physician has prescribed it for you.  If you do not have this equipment, it will be provided for you after surgery. 11. If you have not been contacted by the surgery center/hospital by the day before your surgery, call to confirm the date and time of your surgery. 12. Leave-time from work may vary depending on the type of surgery you have.  Appropriate arrangements should be made prior to surgery with your employer. 13. Prescriptions will be provided immediately following surgery by your doctor.  Fill these as soon as possible after surgery and take the medication as directed. Pain medications will not be refilled on weekends and must be approved by the doctor. 14. Remove nail polish on the operative foot and avoid getting pedicures prior to surgery. 15. Wash the night before surgery.  The night before surgery wash the foot and leg well with water and the antibacterial soap provided. Be sure to pay special attention to beneath the toenails and in between the toes.  Wash for at least three (3) minutes. Rinse thoroughly with water and dry well with a towel.  Perform this wash unless told not to do so by your physician.  Enclosed: 1 Ice pack (please put in freezer the night before surgery)   1 Hibiclens skin cleaner     Pre-op instructions  If you have any questions regarding the instructions, please do not hesitate to call our office.  [place]: [address], [place] , [place] [address] -- [phone]  [place]: [address] [place], [place] [address] -- [phone]  [place]: [address], [place], [place] [address] -- [phone]   Website: [email or web address] 

## 2020-09-25 NOTE — Addendum Note (Signed)
Addended by: Edrick Kins on: 09/25/2020 10:19 AM   Modules accepted: Orders

## 2020-09-29 ENCOUNTER — Telehealth: Payer: Self-pay

## 2020-09-29 NOTE — Telephone Encounter (Signed)
Received surgery paperwork from the [place] office. I tried calling to scheduled surgery but her mailbox is full and will not allow me to leave a message.

## 2020-12-21 ENCOUNTER — Ambulatory Visit (INDEPENDENT_AMBULATORY_CARE_PROVIDER_SITE_OTHER): Payer: Medicaid Other | Admitting: Podiatry

## 2020-12-21 ENCOUNTER — Other Ambulatory Visit: Payer: Self-pay

## 2020-12-21 DIAGNOSIS — M7661 Achilles tendinitis, right leg: Secondary | ICD-10-CM | POA: Diagnosis not present

## 2020-12-21 DIAGNOSIS — M7662 Achilles tendinitis, left leg: Secondary | ICD-10-CM | POA: Diagnosis not present

## 2021-01-04 DIAGNOSIS — M7662 Achilles tendinitis, left leg: Secondary | ICD-10-CM | POA: Diagnosis not present

## 2021-01-04 DIAGNOSIS — M7661 Achilles tendinitis, right leg: Secondary | ICD-10-CM | POA: Diagnosis not present

## 2021-01-04 MED ORDER — BETAMETHASONE SOD PHOS & ACET 6 (3-3) MG/ML IJ SUSP
3.0000 mg | Freq: Once | INTRAMUSCULAR | Status: AC
  Administered 2021-01-04: 3 mg via INTRA_ARTICULAR

## 2021-01-04 NOTE — Progress Notes (Signed)
   HPI: 50 y.o. female presenting today for follow-up evaluation of bilateral Achilles tendon pain and and bilateral ankle pain.  Patient states that most recently she had an acute flareup of her Achilles tendon area to the right lower extremity.  She continues to have severe insertional Achilles tendon pain to the left lower extremity.  This is now been ongoing for over 1 year now despite multiple conservative modalities which have failed to provide any sort of lasting alleviation of symptoms for the patient.  At this point she would like to proceed with surgical intervention to the left posterior heel which was discussed last visit  No past medical history on file.    Physical Exam: General: The patient is alert and oriented x3 in no acute distress.  Dermatology: Skin is warm, dry and supple bilateral lower extremities. Negative for open lesions or macerations.  Vascular: Palpable pedal pulses bilaterally. No edema or erythema noted. Capillary refill within normal limits.  Neurological: Epicritic and protective threshold grossly intact bilaterally.   Musculoskeletal Exam: Pain on palpation noted to the posterior tubercle of the bilateral calcaneus at the insertion of the Achilles tendon left. Range of motion within normal limits.  There is also improved pain on palpation and range of motion to the lateral aspect of the right ankle joint.  Muscle strength 5/5 in all muscle groups bilateral lower extremities.  Assessment: 1. Insertional Achilles tendinitis bilateral 2.  Posterior heel spur bilateral   Plan of Care:  1. Patient was evaluated.  2. Injection of 0.5 mL Celestone Soluspan injected into the retrocalcaneal bursa bilateral today. Care was taken to avoid direct injection into the Achilles tendon left. 3.  Continue meloxicam as needed 4. Today we again discussed the conservative versus surgical management of the presenting pathology. The patient opts for surgical management. All  possible complications and details of the procedure were explained. All patient questions were answered. No guarantees were expressed or implied. 5. Authorization for surgery was re-initiated today. Surgery will consist of retrocalcaneal exostectomy with repair of Achilles tendon left 6.  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 Edrick Kins, DPM    [address], [place] [address]                Office [phone]  Fax [phone]

## 2021-02-24 ENCOUNTER — Telehealth: Payer: Self-pay | Admitting: Urology

## 2021-02-24 NOTE — Telephone Encounter (Signed)
DOS - 03/25/21   REPAIR OF ACHILLES  LEFT --- [id number] CALCANEAL OSTECTOMY LEFT --- [id number]   [facility] EFFECTIVE DATE - 02/12/21   PLAN DEDUCTIBLE - $0.00 OUT OF POCKET - Member's individual out-of-pocket maximum has no limit. COINSURANCE - 0% COPAY -$0.00    RECEIVED FAX FROM [facility] STATING THAT CPT CODES 69507 AND 22575 HAVE BEEN APPROVED, AUTH # [id number], GOOD FROM 03/25/21 - 06/23/21.

## 2021-03-24 ENCOUNTER — Telehealth: Payer: Self-pay

## 2021-03-24 NOTE — Progress Notes (Signed)
Left message with shelley at dr Amalia Hailey office, pt has had multiple messages left and has not called back

## 2021-03-24 NOTE — Telephone Encounter (Signed)
Trying to get a hold of Joanna Marks in reference to her surgery with Dr. Amalia Hailey on 03/25/2021. On 03/18/2021 she answered but said she would have to call me back. No return call. Left messages on 03/22/2021, 03/23/2021 and 03/24/2021.

## 2021-03-24 NOTE — Progress Notes (Signed)
Spoke with Darrick Penna, surgical coordinator for Dr. Amalia Hailey requesting orders for patient.

## 2021-03-24 NOTE — Telephone Encounter (Signed)
Received a call from [place] with [facility] perop testing. They also left several messages for Amelya to call for prescreening and they have not received a call back. H&P has not been received. Notified Dr. Amalia Hailey that this surgery has been canceled.

## 2021-03-25 ENCOUNTER — Ambulatory Visit (HOSPITAL_BASED_OUTPATIENT_CLINIC_OR_DEPARTMENT_OTHER): Admission: RE | Admit: 2021-03-25 | Payer: Medicaid Other | Source: Home / Self Care | Admitting: Podiatry

## 2021-03-25 SURGERY — REPAIR, TENDON, ACHILLES
Anesthesia: Choice | Site: Heel | Laterality: Left

## 2021-04-02 ENCOUNTER — Encounter: Payer: Medicaid Other | Admitting: Podiatry

## 2021-04-09 ENCOUNTER — Encounter: Payer: Medicaid Other | Admitting: Podiatry

## 2021-04-23 ENCOUNTER — Encounter: Payer: Medicaid Other | Admitting: Podiatry

## 2021-06-04 ENCOUNTER — Ambulatory Visit: Payer: Medicaid Other | Admitting: Podiatry

## 2021-06-04 ENCOUNTER — Encounter: Payer: Self-pay | Admitting: Podiatry

## 2021-06-04 ENCOUNTER — Other Ambulatory Visit: Payer: Self-pay

## 2021-06-04 DIAGNOSIS — M7661 Achilles tendinitis, right leg: Secondary | ICD-10-CM | POA: Diagnosis not present

## 2021-06-04 DIAGNOSIS — M7662 Achilles tendinitis, left leg: Secondary | ICD-10-CM

## 2021-06-20 DIAGNOSIS — M7662 Achilles tendinitis, left leg: Secondary | ICD-10-CM | POA: Diagnosis not present

## 2021-06-20 DIAGNOSIS — M7661 Achilles tendinitis, right leg: Secondary | ICD-10-CM | POA: Diagnosis not present

## 2021-06-20 MED ORDER — BETAMETHASONE SOD PHOS & ACET 6 (3-3) MG/ML IJ SUSP
3.0000 mg | Freq: Once | INTRAMUSCULAR | Status: AC
  Administered 2021-06-20: 3 mg via INTRA_ARTICULAR

## 2021-06-20 NOTE — Progress Notes (Signed)
   HPI: 50 y.o. female presenting today for follow-up evaluation of bilateral Achilles tendon pain and and bilateral ankle pain.  Patient states that most recently she had an acute flareup of her Achilles tendon area to the bilateral lower extremities.  This is now been ongoing for over 1 year now despite multiple conservative modalities which have failed to provide any sort of lasting alleviation of symptoms for the patient.    Last visit in May 2022 the patient was scheduled for surgery.  Unfortunately patient got COVID and became very busy at work and she is unable to take time off for the recovery period.  She would like to pursue conservative treatment today  No past medical history on file.    Physical Exam: General: The patient is alert and oriented x3 in no acute distress.  Dermatology: Skin is warm, dry and supple bilateral lower extremities. Negative for open lesions or macerations.  Vascular: Palpable pedal pulses bilaterally. No edema or erythema noted. Capillary refill within normal limits.  Neurological: Epicritic and protective threshold grossly intact bilaterally.   Musculoskeletal Exam: Pain on palpation noted to the posterior tubercle of the bilateral calcaneus at the insertion of the Achilles tendon left. Range of motion within normal limits.  There is also improved pain on palpation and range of motion to the lateral aspect of the right ankle joint.  Muscle strength 5/5 in all muscle groups bilateral lower extremities.  Assessment: 1. Insertional Achilles tendinitis bilateral 2.  Posterior heel spur bilateral   Plan of Care:  1. Patient was evaluated.  2. Injection of 0.5 mL Celestone Soluspan injected into the retrocalcaneal bursa bilateral today. Care was taken to avoid direct injection into the Achilles tendon left. 3.  Prescription for Medrol Dosepak.  After completion of the Dosepak continue meloxicam as needed 4. Today again we again discussed the conservative  versus surgical management of the presenting pathology. The patient opts for surgical management. All possible complications and details of the procedure were explained. All patient questions were answered. No guarantees were expressed or implied. 5.  Currently the patient is not in a position to pursue surgery due to work 6.  Return to clinic as needed   *New Nurse, [facility] [employer]   Edrick Kins, DPM [facility]  Dr. Edrick Kins, DPM    [address], [place] [address]                Office [phone]  Fax [phone]

## 2021-07-16 ENCOUNTER — Other Ambulatory Visit: Payer: Self-pay

## 2021-07-16 ENCOUNTER — Ambulatory Visit (INDEPENDENT_AMBULATORY_CARE_PROVIDER_SITE_OTHER): Payer: Medicaid Other | Admitting: Podiatry

## 2021-07-16 ENCOUNTER — Encounter: Payer: Self-pay | Admitting: Podiatry

## 2021-07-16 DIAGNOSIS — M7662 Achilles tendinitis, left leg: Secondary | ICD-10-CM

## 2021-07-16 DIAGNOSIS — M7732 Calcaneal spur, left foot: Secondary | ICD-10-CM

## 2021-07-16 MED ORDER — BETAMETHASONE SOD PHOS & ACET 6 (3-3) MG/ML IJ SUSP: 3.0000 mg | Freq: Once | INTRAMUSCULAR | Status: AC

## 2021-07-16 NOTE — Progress Notes (Signed)
   HPI: 50 y.o. female presenting today for follow-up evaluation of bilateral Achilles tendon pain and and bilateral ankle pain.  Patient states that her right lower extremity is doing quite well.  She is now having an acute flareup of her left lower extremity and Achilles tendon area.  She is requesting injection today.  She states that she would like to have surgery in mid January.  She presents for follow-up treatment evaluation.  No new complaints at this time  No past medical history on file.    Physical Exam: General: The patient is alert and oriented x3 in no acute distress.  Dermatology: Skin is warm, dry and supple bilateral lower extremities. Negative for open lesions or macerations.  Vascular: Palpable pedal pulses bilaterally. No edema or erythema noted. Capillary refill within normal limits.  Neurological: Epicritic and protective threshold grossly intact bilaterally.   Musculoskeletal Exam: Pain on palpation noted to the posterior tubercle of the bilateral calcaneus at the insertion of the Achilles tendon left. Range of motion within normal limits.  There is also improved pain on palpation and range of motion to the lateral aspect of the right ankle joint.  Muscle strength 5/5 in all muscle groups bilateral lower extremities.  Assessment: 1. Insertional Achilles tendinitis bilateral; right is currently asymptomatic 2.  Posterior heel spur bilateral   Plan of Care:  1. Patient was evaluated.  2. Injection of 0.5 mL Celestone Soluspan injected into the retrocalcaneal bursa left today. Care was taken to avoid direct injection into the Achilles tendon left. 3.  Continue meloxicam 15 mg daily as needed 4.   Patient states that she gets full short-term and long-term disability after the New Year's.  She would like to have surgery mid-January 2023.  Surgical consent was obtained prior for the left.  Patient will contact our surgery scheduler to arrange surgery.  Return to clinic 1  week postop   *New Nurse, [facility] [employer].    Edrick Kins, DPM [facility]  Dr. Edrick Kins, DPM    [address], [place] [address]                Office [phone]  Fax [phone]

## 2021-09-24 ENCOUNTER — Ambulatory Visit: Payer: Medicaid Other | Admitting: Podiatry

## 2021-09-24 ENCOUNTER — Ambulatory Visit (INDEPENDENT_AMBULATORY_CARE_PROVIDER_SITE_OTHER): Payer: Medicaid Other

## 2021-09-24 ENCOUNTER — Encounter: Payer: Self-pay | Admitting: Podiatry

## 2021-09-24 ENCOUNTER — Encounter: Payer: Self-pay | Admitting: *Deleted

## 2021-09-24 ENCOUNTER — Other Ambulatory Visit: Payer: Self-pay

## 2021-09-24 DIAGNOSIS — M7732 Calcaneal spur, left foot: Secondary | ICD-10-CM | POA: Diagnosis not present

## 2021-09-24 DIAGNOSIS — M7662 Achilles tendinitis, left leg: Secondary | ICD-10-CM | POA: Diagnosis not present

## 2021-10-01 DIAGNOSIS — M7662 Achilles tendinitis, left leg: Secondary | ICD-10-CM | POA: Diagnosis not present

## 2021-10-01 MED ORDER — BETAMETHASONE SOD PHOS & ACET 6 (3-3) MG/ML IJ SUSP
3.0000 mg | Freq: Once | INTRAMUSCULAR | Status: AC
  Administered 2021-10-01: 3 mg via INTRA_ARTICULAR

## 2021-10-01 NOTE — Progress Notes (Signed)
° °  HPI: 51 y.o. female presenting today for follow-up evaluation of bilateral Achilles tendon pain and and bilateral ankle pain.  Patient states that her right lower extremity continues to do well.  She is having increased pain and tenderness associated to the left lower extremity.  Again she is requesting an injection today.  It has been a few months since she has been in the office.  She says that most recently she is having exacerbation of her pain and experiences swelling with significant pain with walking.  She presents for further treatment and evaluation  No past medical history on file.    Physical Exam: General: The patient is alert and oriented x3 in no acute distress.  Dermatology: Skin is warm, dry and supple bilateral lower extremities. Negative for open lesions or macerations.  Vascular: Palpable pedal pulses bilaterally. No edema or erythema noted. Capillary refill within normal limits.  Neurological: Epicritic and protective threshold grossly intact bilaterally.   Musculoskeletal Exam: There continues to be pain on palpation noted to the posterior tubercle of the bilateral calcaneus at the insertion of the Achilles tendon left. Range of motion within normal limits.  There is also improved pain on palpation and range of motion to the lateral aspect of the right ankle joint.  Muscle strength 5/5 in all muscle groups bilateral lower extremities.  Assessment: 1. Insertional Achilles tendinitis bilateral; right is currently asymptomatic 2.  Posterior heel spur bilateral   Plan of Care:  1. Patient was evaluated.  2. Injection of 0.5 mL Celestone Soluspan injected into the retrocalcaneal bursa left today. Care was taken to avoid direct injection into the Achilles tendon left. 3.  Continue meloxicam 15 mg daily as needed.  Refill prescription for meloxicam 15 mg daily 4.  Today the patient continues to have pain and tenderness associated the left heel.  We are going to order an MRI  left.  She says that recently she has had significant exacerbation of her pain.  MRI ordered left heel without contrast at [facility].  Return to clinic after MRI to review results and discuss likely surgery again  *New Nurse, [facility] [employer].    Edrick Kins, DPM [facility]  Dr. Edrick Kins, DPM    [address], [place] [address]                Office [phone]  Fax [phone]

## 2021-10-11 ENCOUNTER — Ambulatory Visit
Admission: RE | Admit: 2021-10-11 | Discharge: 2021-10-11 | Disposition: A | Discharge status: Home / Self Care | Payer: Medicaid Other | Source: Ambulatory Visit | Attending: Podiatry | Admitting: Podiatry

## 2021-10-11 DIAGNOSIS — M7662 Achilles tendinitis, left leg: Secondary | ICD-10-CM

## 2021-10-29 ENCOUNTER — Ambulatory Visit: Payer: Medicaid Other | Admitting: Podiatry

## 2021-10-29 ENCOUNTER — Other Ambulatory Visit: Payer: Self-pay

## 2021-10-29 DIAGNOSIS — M7662 Achilles tendinitis, left leg: Secondary | ICD-10-CM

## 2021-10-29 DIAGNOSIS — M7732 Calcaneal spur, left foot: Secondary | ICD-10-CM

## 2021-10-29 NOTE — Progress Notes (Signed)
? ?  HPI: 51 y.o. female presenting today for follow-up evaluation of Achilles tendon injury with pain left lower extremity.  Patient has had chronic pain in her left lower extremity for over 1 year now.  We have tried multiple conservative treatment modalities.  Last visit on 09/24/2021 we ordered an MRI to better evaluate the Achilles tendon.  She presents today to review the MRI results and discuss surgery again ? ?No past medical history on file. ?Past Surgical History:  ?Procedure Laterality Date  ? COLONOSCOPY WITH PROPOFOL N/A 10/09/2019  ? Procedure: COLONOSCOPY WITH PROPOFOL;  Surgeon: Lin Landsman, MD;  Location: [facility] ENDOSCOPY;  Service: Gastroenterology;  Laterality: N/A;  Priority 4  ? ?Allergies  ?Allergen Reactions  ? Penicillins Hives  ? ? ? ?  ?Physical Exam: ?General: The patient is alert and oriented x3 in no acute distress. ? ?Dermatology: Skin is warm, dry and supple bilateral lower extremities. Negative for open lesions or macerations. ? ?Vascular: Palpable pedal pulses bilaterally. No edema or erythema noted. Capillary refill within normal limits. ? ?Neurological: Epicritic and protective threshold grossly intact bilaterally.  ? ?Musculoskeletal Exam: There continues to be pain on palpation noted to the posterior tubercle of the left calcaneus at the insertion of the Achilles tendon left.  Today upon evaluation there is a palpable dell noted just proximal to the calcaneus.  Inability for plantarflexion left lower extremity.  Positive Thompson test.  Significant pain and tenderness posterior Achilles ? ?MR HEEL LT WO CONTRAST 10/11/2021 ?IMPRESSION: ?Distal Achilles tendon rupture with up to 5.7 cm retraction. ?  ?Torn anterior talofibular ligament. Intact calcaneofibular and ?posterior talofibular ligaments. ?  ?Chronic deltoid ligament sprain. ?  ?Mild tenosynovitis of the inframalleolar peroneal tendons and ?supramalleolar posterior tibial tendon. ?  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 No evidence ?of unstable fragment. ? ?Assessment: ?1.  Achilles tendon rupture with retraction elderly ?2.  Posterior heel spur bilateral ? ? ?Plan of Care:  ?1. Patient was evaluated.  MRI results were reviewed with the patient ?2.  Cam boot dispensed.  Weightbearing as tolerated up until the date of surgery ?3. Today again we discussed the conservative versus surgical management of the presenting pathology. The patient opts for surgical management. All possible complications and details of the procedure were explained. All patient questions were answered. No guarantees were expressed or implied. ?4. Authorization for surgery was initiated today. Surgery will consist of posterior heel spur resection left.  Primary repair Achilles tendon left.  Possible FHL tendon transfer left.  Possible gastroc lengthening left ?5.  Return to clinic 1 week postop ? ? ?*New Nurse, [facility] [employer].   ? ?Edrick Kins, DPM ?[facility] ? ?Dr. Edrick Kins, DPM  ?  ?[address]                                  ?[place], [place] [address]                ?Office [phone]  ?Fax [phone] ? ? ? ? ? ? ?

## 2021-11-01 ENCOUNTER — Telehealth: Payer: Self-pay

## 2021-11-01 NOTE — Telephone Encounter (Signed)
Received surgery paperwork from the [place] office. Left a message for Aliyana to return the call and schedule surgery with Dr. Amalia Hailey ?

## 2021-12-07 ENCOUNTER — Telehealth: Payer: Self-pay

## 2021-12-07 NOTE — Telephone Encounter (Signed)
DOS 12/31/2021 ? ?TENDO-ACHILLES LENGTHENING LT - 74128 ?REPAIR ACHILLES LT - [phone] ?CALCANEAL OSTEOTOMY LT - 72094 ?TENDON TRANSFER LT - [phone] ? ?[employer] MEDICAID EFFECTIVE DATE - 02/12/2021 ? ?PLAN DEDUCTIBLE - $0.00 ?OUT OF POCKET - NO LIMIT ?COPAY $0.00 ?COINSURANCE - 0% ? ? ? ?NOTIFICATION/PRIOR AUTHORIZATION NUMBER CASE STATUS CASE STATUS REASON PRIMARY CARE PHYSICIAN ?[id number] Closed Case Was Managed And Is Now Complete Rebeca Moretto ?ADVANCE NOTIFY DATE/TIME ADMISSION NOTIFY DATE/TIME ?12/02/2021 02:11 PM CDT - ?COVERAGE STATUS ?OVERALL COVERAGE STATUS ?Covered/Approved ?1-4 CODE DESCRIPTION COVERAGE ?STATUS DECISION DATE ?[place] ?Coverage determination is reflected for the facility admission and is not a guarantee of payment for ongoing services. ?Covered/Approved 12/06/2021 1 [id number] Repair, primary, open or percutaneous, r more Covered/Approved 12/06/2021 2 [id number] Lengthening or shortening of tendon, leg more Covered/Approved 12/06/2021 3 [id number] Transfer or transplant of single tendon more Covered/Approved 12/06/2021 4 [id number] Ostectomy, calcaneus; Covered/Approved 12/06/2021 ?

## 2021-12-13 DIAGNOSIS — M79676 Pain in unspecified toe(s): Secondary | ICD-10-CM

## 2021-12-27 ENCOUNTER — Inpatient Hospital Stay
Admission: RE | Admit: 2021-12-27 | Discharge: 2021-12-27 | Disposition: A | Discharge status: Home / Self Care | Payer: Medicaid Other | Source: Ambulatory Visit

## 2021-12-27 ENCOUNTER — Telehealth: Payer: Self-pay

## 2021-12-27 NOTE — Telephone Encounter (Signed)
Joanna Marks called to cancel her surgery with Dr. Amalia Hailey on 12/31/2021. She stated she will not be approved for short tem disability will 08/15/2022. She can't afford to be out of work without this. She will contact us towards the end of the year to get the surgery rescheduled. Notified Dr. Amalia Hailey ?

## 2021-12-27 NOTE — Patient Instructions (Signed)
Your procedure is scheduled on: 12/31/2021  ?Report to the Registration Desk on the 1st floor of the [facility]. ?To find out your arrival time, please call [phone] between 1PM - 3PM on: 12/30/2021  ?If your arrival time is 6:00 am, do not arrive prior to that time as the [facility] entrance doors do not open until 6:00 am. ? ?REMEMBER: ?Instructions that are not followed completely may result in serious medical risk, up to and including death; or upon the discretion of your surgeon and anesthesiologist your surgery may need to be rescheduled. ? ?Do not eat food after midnight the night before surgery.  ?No gum chewing, lozengers or hard candies. ? ? ?In addition, your doctor has ordered for you to drink the provided  ?Ensure Pre-Surgery Clear Carbohydrate Drink  ?Gatorade G2 ?Drinking this carbohydrate drink up to two hours before surgery helps to reduce insulin resistance and improve patient outcomes. Please complete drinking 2 hours prior to scheduled arrival time. ? ?TAKE THESE MEDICATIONS THE MORNING OF SURGERY WITH A SIP OF WATER: ?AMLODIPINE ?EFFEXOR ? ?Stop Anti-inflammatories (NSAIDS) such as Advil, Aleve, Ibuprofen, Motrin, Naproxen, Naprosyn and Aspirin based products such as Excedrin, Goodys Powder, BC Powder AND MOBIC  ?Stop ANY OVER THE COUNTER supplements until after surgery. ?You may however, continue to take Tylenol if needed for pain up until the day of surgery. ? ?No Alcohol for 24 hours before or after surgery. ? ?No Smoking including e-cigarettes for 24 hours prior to surgery.  ?No chewable tobacco products for at least 6 hours prior to surgery.  ?No nicotine patches on the day of surgery. ? ?Do not use any "recreational" drugs for at least a week prior to your surgery.  ?Please be advised that the combination of cocaine and anesthesia may have negative outcomes, up to and including death. ?If you test positive for cocaine, your surgery will be cancelled. ? ?On the morning of surgery  brush your teeth with toothpaste and water, you may rinse your mouth with mouthwash if you wish. ?Do not swallow any toothpaste or mouthwash. ? ?Use CHG Soap or wipes as directed on instruction sheet. ? ?Do not wear jewelry, make-up, hairpins, clips or nail polish. ? ?Do not wear lotions, powders, or perfumes.  ? ?Do not shave body from the neck down 48 hours prior to surgery just in case you cut yourself which could leave a site for infection.  ?Also, freshly shaved skin may become irritated if using the CHG soap. ? ?Contact lenses, hearing aids and dentures may not be worn into surgery. ? ?Do not bring valuables to the hospital. [facility] is not responsible for any missing/lost belongings or valuables.  ? ?Total Shoulder Arthroplasty:  use Benzolyl Peroxide 5% Gel as directed on instruction sheet. ? ?Fleets enema or bowel prep as directed. ? ?Bring your C-PAP to the hospital with you in case you may have to spend the night.  ? ?Notify your doctor if there is any change in your medical condition (cold, fever, infection). ? ?Wear comfortable clothing (specific to your surgery type) to the hospital. ? ?After surgery, you can help prevent lung complications by doing breathing exercises.  ?Take deep breaths and cough every 1-2 hours. Your doctor may order a device called an Incentive Spirometer to help you take deep breaths. ?When coughing or sneezing, hold a pillow firmly against your incision with both hands. This is called ?splinting.? Doing this helps protect your incision. It also decreases belly discomfort. ? ?If  you are being admitted to the hospital overnight, leave your suitcase in the car. ?After surgery it may be brought to your room. ? ?If you are being discharged the day of surgery, you will not be allowed to drive home. ?You will need a responsible adult (18 years or older) to drive you home and stay with you that night.  ? ?If you are taking public transportation, you will need to have a responsible  adult (18 years or older) with you. ?Please confirm with your physician that it is acceptable to use public transportation.  ? ?Please call the [place] Dept. at [phone] if you have any questions about these instructions. ? ?Surgery Visitation Policy: ? ?Patients undergoing a surgery or procedure may have two family members or support persons with them as long as the person is not COVID-19 positive or experiencing its symptoms.  ? ?Inpatient Visitation:   ? ?Visiting hours are 7 a.m. to 8 p.m. ?Up to four visitors are allowed at one time in a patient room, including children. The visitors may rotate out with other people during the day. One designated support person (adult) may remain overnight.  ?

## 2021-12-31 ENCOUNTER — Ambulatory Visit: Admit: 2021-12-31 | Payer: Medicaid Other | Admitting: Podiatry

## 2021-12-31 SURGERY — REPAIR, TENDON, ACHILLES
Anesthesia: Choice | Site: Heel | Laterality: Left

## 2022-01-07 ENCOUNTER — Encounter: Payer: Medicaid Other | Admitting: Podiatry

## 2022-01-14 ENCOUNTER — Encounter: Payer: Medicaid Other | Admitting: Podiatry

## 2022-01-28 ENCOUNTER — Encounter: Payer: Medicaid Other | Admitting: Podiatry

## 2023-05-02 IMAGING — MR MR HEEL *L* W/O CM
4 of 6 series · 13 of 40 positions shown · non-contrast
Comparison: Left foot radiograph 09/24/2021.

CLINICAL DATA: Ankle pain, tendon abnormality suspected, neg xray

EXAM:
MR OF THE LEFT HEEL WITHOUT CONTRAST
TECHNIQUE: Multiplanar, multisequence MR imaging of the left heel was
performed. No intravenous contrast was administered.

[Series 3: T2 fat-sat · axial · left · 3.0mm · 0.25mm/px · z∈[-29,+55]mm · 3 of 33 slices shown (1 of 2)]
[im 6/33]
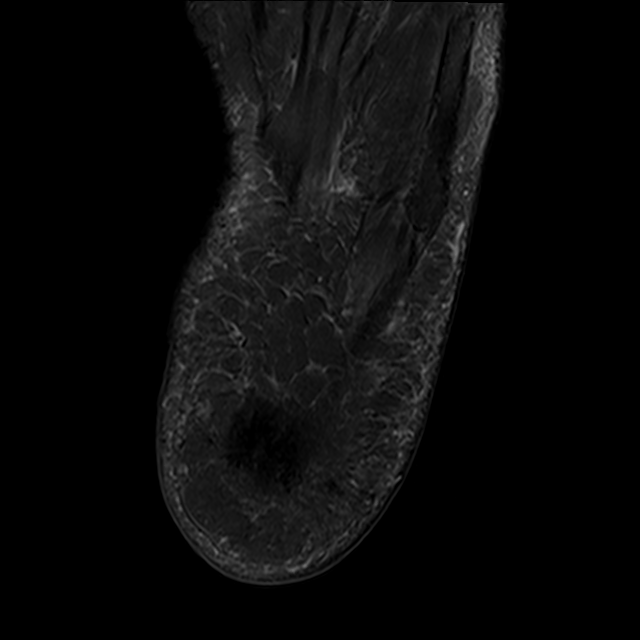
[im 17/33]
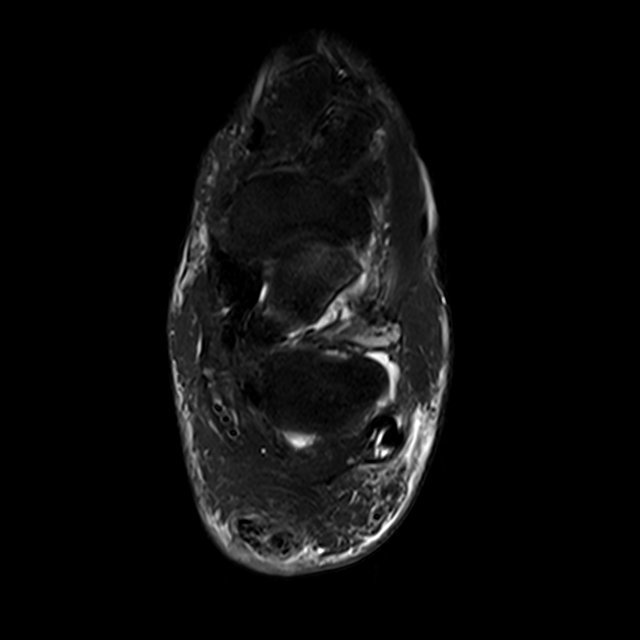
[im 27/33]
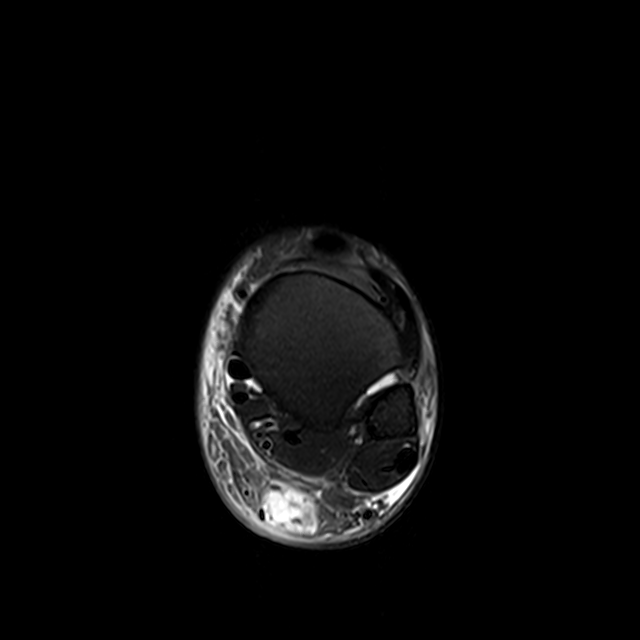

[Series 4: PD fat-sat · axial · left · 3.0mm · 0.25mm/px · z∈[-49,+55]mm · 4 of 33 slices shown]
[im 1/33]
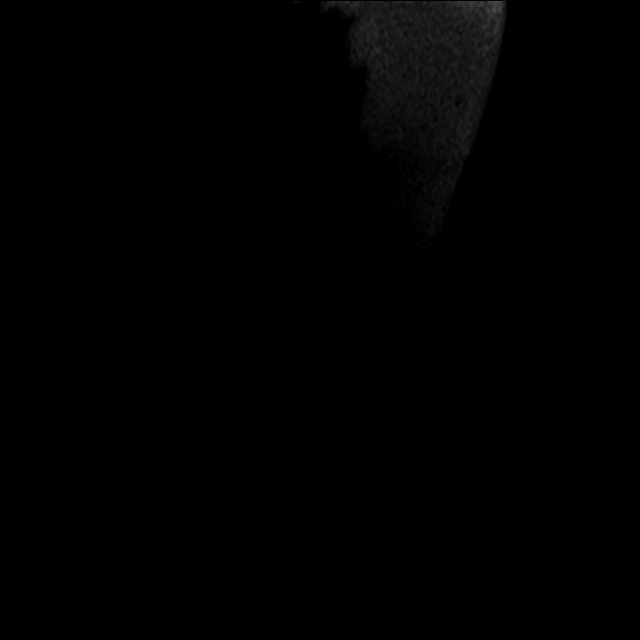
[im 6/33]
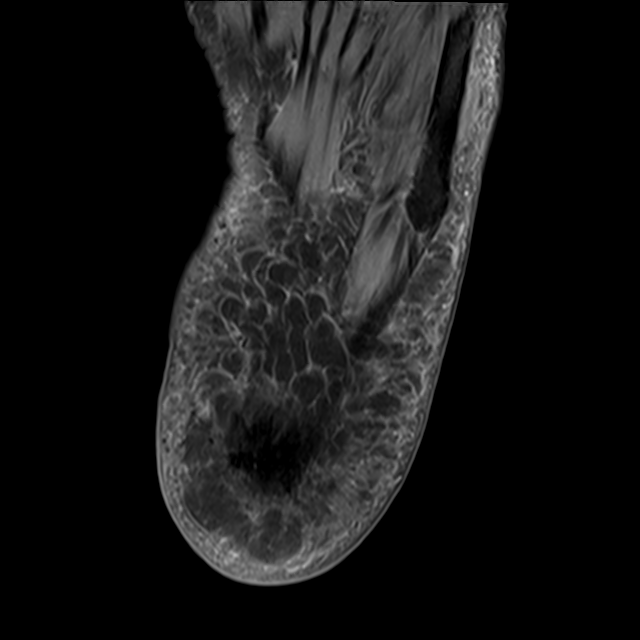
[im 17/33]
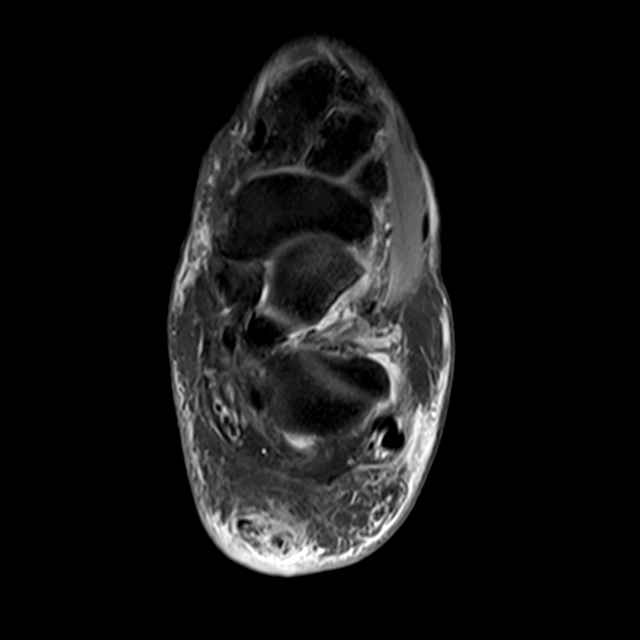
[im 27/33]
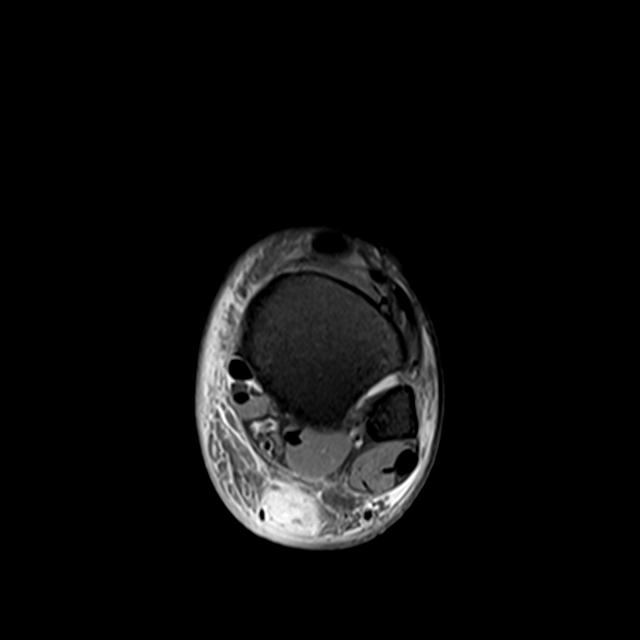

[Series 5: T1 · sagittal · left · 4.0mm · 0.27mm/px · 3 of 21 slices shown]
[im 1/21]
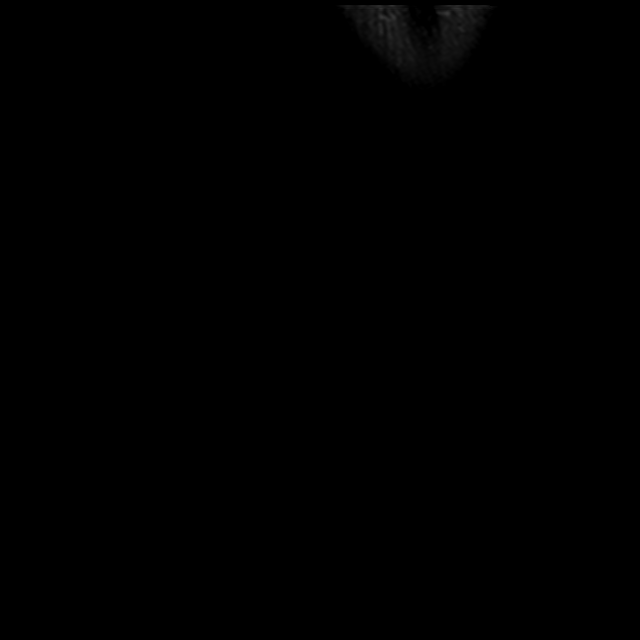
[im 11/21]
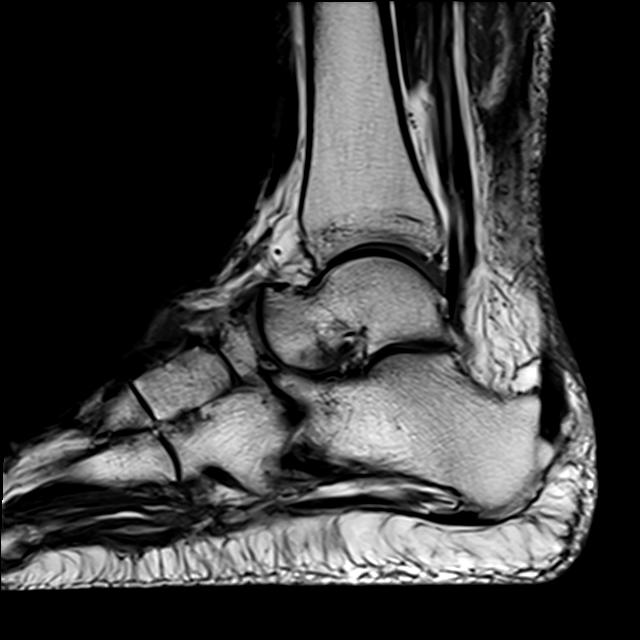
[im 21/21]
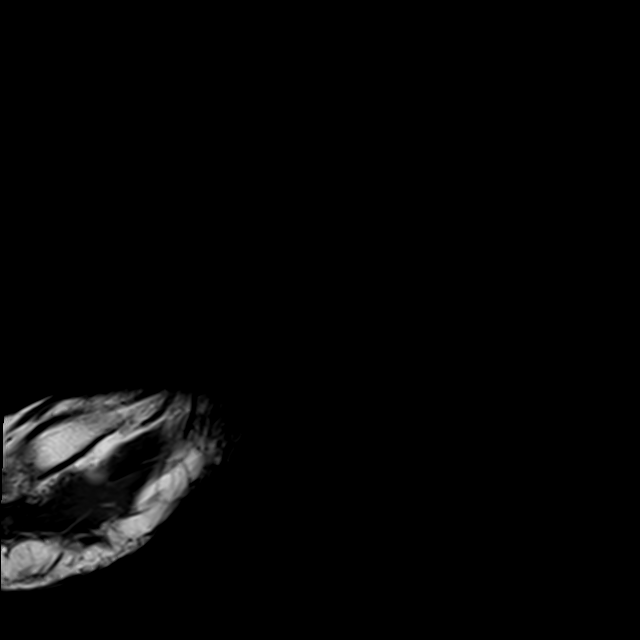

[Series 7: T2 fat-sat · coronal · left · 3.0mm · 0.28mm/px · 3 of 33 slices shown (2 of 2)]
[im 6/33]
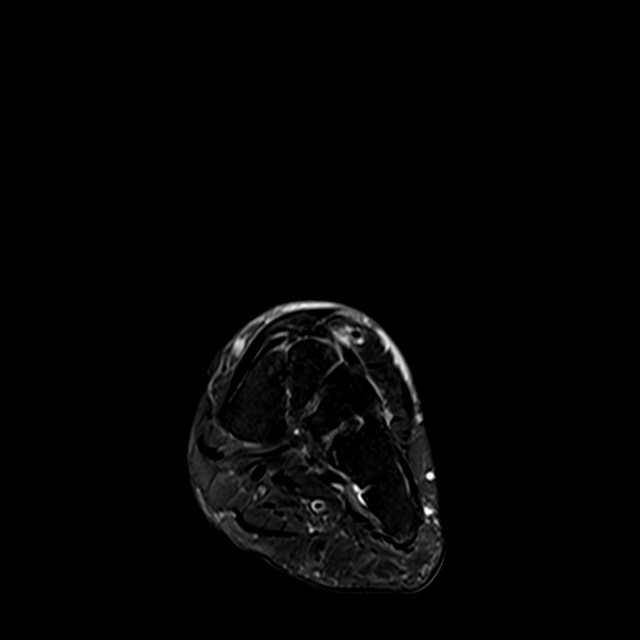
[im 17/33]
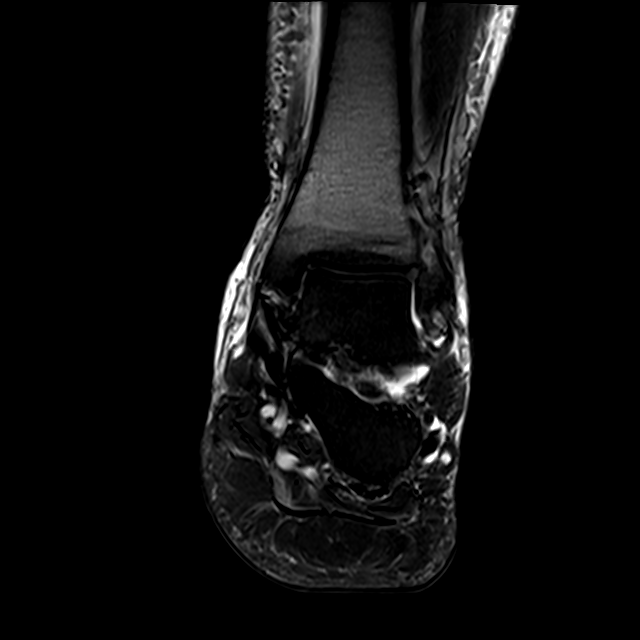
[im 27/33]
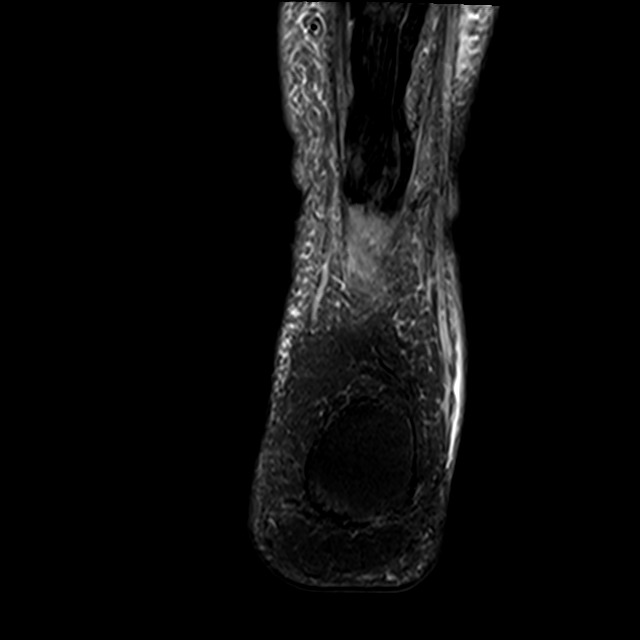

[13 of 40 positions shown; findings below may reference images not displayed]

FINDINGS: Bones/Joint/Cartilage

There is no acute fracture or dislocation. There is a low-grade
osteochondral defect of the medial talar dome without evidence of an
unstable fragment. Mild talonavicular and calcaneocuboid
osteoarthritis. Mild midfoot degenerative changes. Plantar and
dorsal calcaneal spurring.

Ligaments

There is a tear of the anterior talofibular ligament (axial T2 image
15). Intact calcaneofibular ligament and posterior talofibular
ligaments. Intact high ankle ligaments. Chronic deltoid sprain.
Intact spring ligament.

Muscles and Tendons

There is a full-thickness tear of the distal Achilles tendon, with
up to 5.7 cm retraction. There is a small amount of tendon remaining
attached to the calcaneus.

Mild tenosynovitis of the peroneal tendons below the lateral
malleolus. Mild tenosynovitis of the posterior tibial tendon above
the medial malleolus. The anterior tendons are unremarkable without
significant tenosynovitis or tear. The plantar fascia is intact.

Soft tissues

Adjacent and generalized soft tissue swelling of the ankle.
IMPRESSION: Distal Achilles tendon rupture with up to 5.7 cm retraction.

Torn anterior talofibular ligament. Intact calcaneofibular and
posterior talofibular ligaments.

Chronic deltoid ligament sprain.

Mild tenosynovitis of the inframalleolar peroneal tendons and
supramalleolar posterior tibial tendon.

Low-grade osteochondral defect of the medial talar dome. No evidence
of unstable fragment.

## 2023-06-16 ENCOUNTER — Other Ambulatory Visit: Payer: 59

## 2024-03-27 ENCOUNTER — Other Ambulatory Visit: Payer: Self-pay | Admitting: Family Medicine

## 2024-03-27 DIAGNOSIS — Z1231 Encounter for screening mammogram for malignant neoplasm of breast: Secondary | ICD-10-CM

## 2024-04-29 ENCOUNTER — Encounter
